# Patient Record
Sex: Male | Born: 1955 | Hispanic: No | Smoking: Current every day smoker
Health system: Southern US, Community
[De-identification: ages and names within clinical notes are randomized; demographics above are authoritative.]

## PROBLEM LIST (undated history)

## (undated) DIAGNOSIS — I10 Essential (primary) hypertension: Secondary | ICD-10-CM

## (undated) HISTORY — PX: LEG AMPUTATION: SHX1105

---

## 2000-11-05 ENCOUNTER — Encounter
Admission: RE | Admit: 2000-11-05 | Discharge: 2001-02-03 | Payer: Self-pay | Admitting: Physical Medicine & Rehabilitation

## 2007-03-21 ENCOUNTER — Ambulatory Visit: Payer: Self-pay | Admitting: Family Medicine

## 2007-07-18 ENCOUNTER — Ambulatory Visit: Payer: Self-pay | Admitting: Family Medicine

## 2007-10-13 ENCOUNTER — Ambulatory Visit: Payer: Self-pay | Admitting: Family Medicine

## 2008-01-27 ENCOUNTER — Ambulatory Visit: Payer: Self-pay | Admitting: Internal Medicine

## 2008-06-24 ENCOUNTER — Ambulatory Visit: Payer: Self-pay | Admitting: Internal Medicine

## 2008-08-16 ENCOUNTER — Ambulatory Visit: Payer: Self-pay | Admitting: Family Medicine

## 2008-09-08 ENCOUNTER — Ambulatory Visit: Payer: Self-pay | Admitting: Internal Medicine

## 2010-11-22 ENCOUNTER — Ambulatory Visit: Payer: Self-pay | Admitting: Family

## 2011-03-29 ENCOUNTER — Ambulatory Visit: Payer: Self-pay | Admitting: Internal Medicine

## 2011-05-16 ENCOUNTER — Ambulatory Visit: Payer: Self-pay | Admitting: Internal Medicine

## 2013-02-02 ENCOUNTER — Emergency Department: Payer: Self-pay | Admitting: Emergency Medicine

## 2013-07-12 ENCOUNTER — Emergency Department: Payer: Self-pay | Admitting: Emergency Medicine

## 2013-07-12 LAB — BASIC METABOLIC PANEL
ANION GAP: 6 — AB (ref 7–16)
BUN: 10 mg/dL (ref 7–18)
CALCIUM: 9.2 mg/dL (ref 8.5–10.1)
Chloride: 103 mmol/L (ref 98–107)
Co2: 26 mmol/L (ref 21–32)
Creatinine: 1.11 mg/dL (ref 0.60–1.30)
EGFR (Non-African Amer.): >60
GLUCOSE: 132 mg/dL — AB (ref 65–99)
Osmolality: 271 (ref 275–301)
Potassium: 4 mmol/L (ref 3.5–5.1)
Sodium: 135 mmol/L — ABNORMAL LOW (ref 136–145)

## 2013-07-12 LAB — CBC
HCT: 44.5 % (ref 40.0–52.0)
HGB: 15.1 g/dL (ref 13.0–18.0)
MCH: 31.4 pg (ref 26.0–34.0)
MCHC: 33.9 g/dL (ref 32.0–36.0)
MCV: 93 fL (ref 80–100)
PLATELETS: 154 10*3/uL (ref 150–440)
RBC: 4.79 10*6/uL (ref 4.40–5.90)
RDW: 13.1 % (ref 11.5–14.5)
WBC: 7.9 10*3/uL (ref 3.8–10.6)

## 2013-07-12 LAB — PRO B NATRIURETIC PEPTIDE: B-Type Natriuretic Peptide: 41 pg/mL (ref 0–125)

## 2013-07-12 LAB — TROPONIN I: Troponin-I: <0.02 ng/mL

## 2015-01-22 IMAGING — CR DG CHEST 2V
1 series · 2 of 2 positions shown · non-contrast
Comparison: Chest x-ray 05/16/2011.

CLINICAL DATA: Cough and shortness of breath.

EXAM:
CHEST  2 VIEW

[Series 1: w chest pa · 0.14mm/px · 2 of 2 slices shown]
[im 1/2]
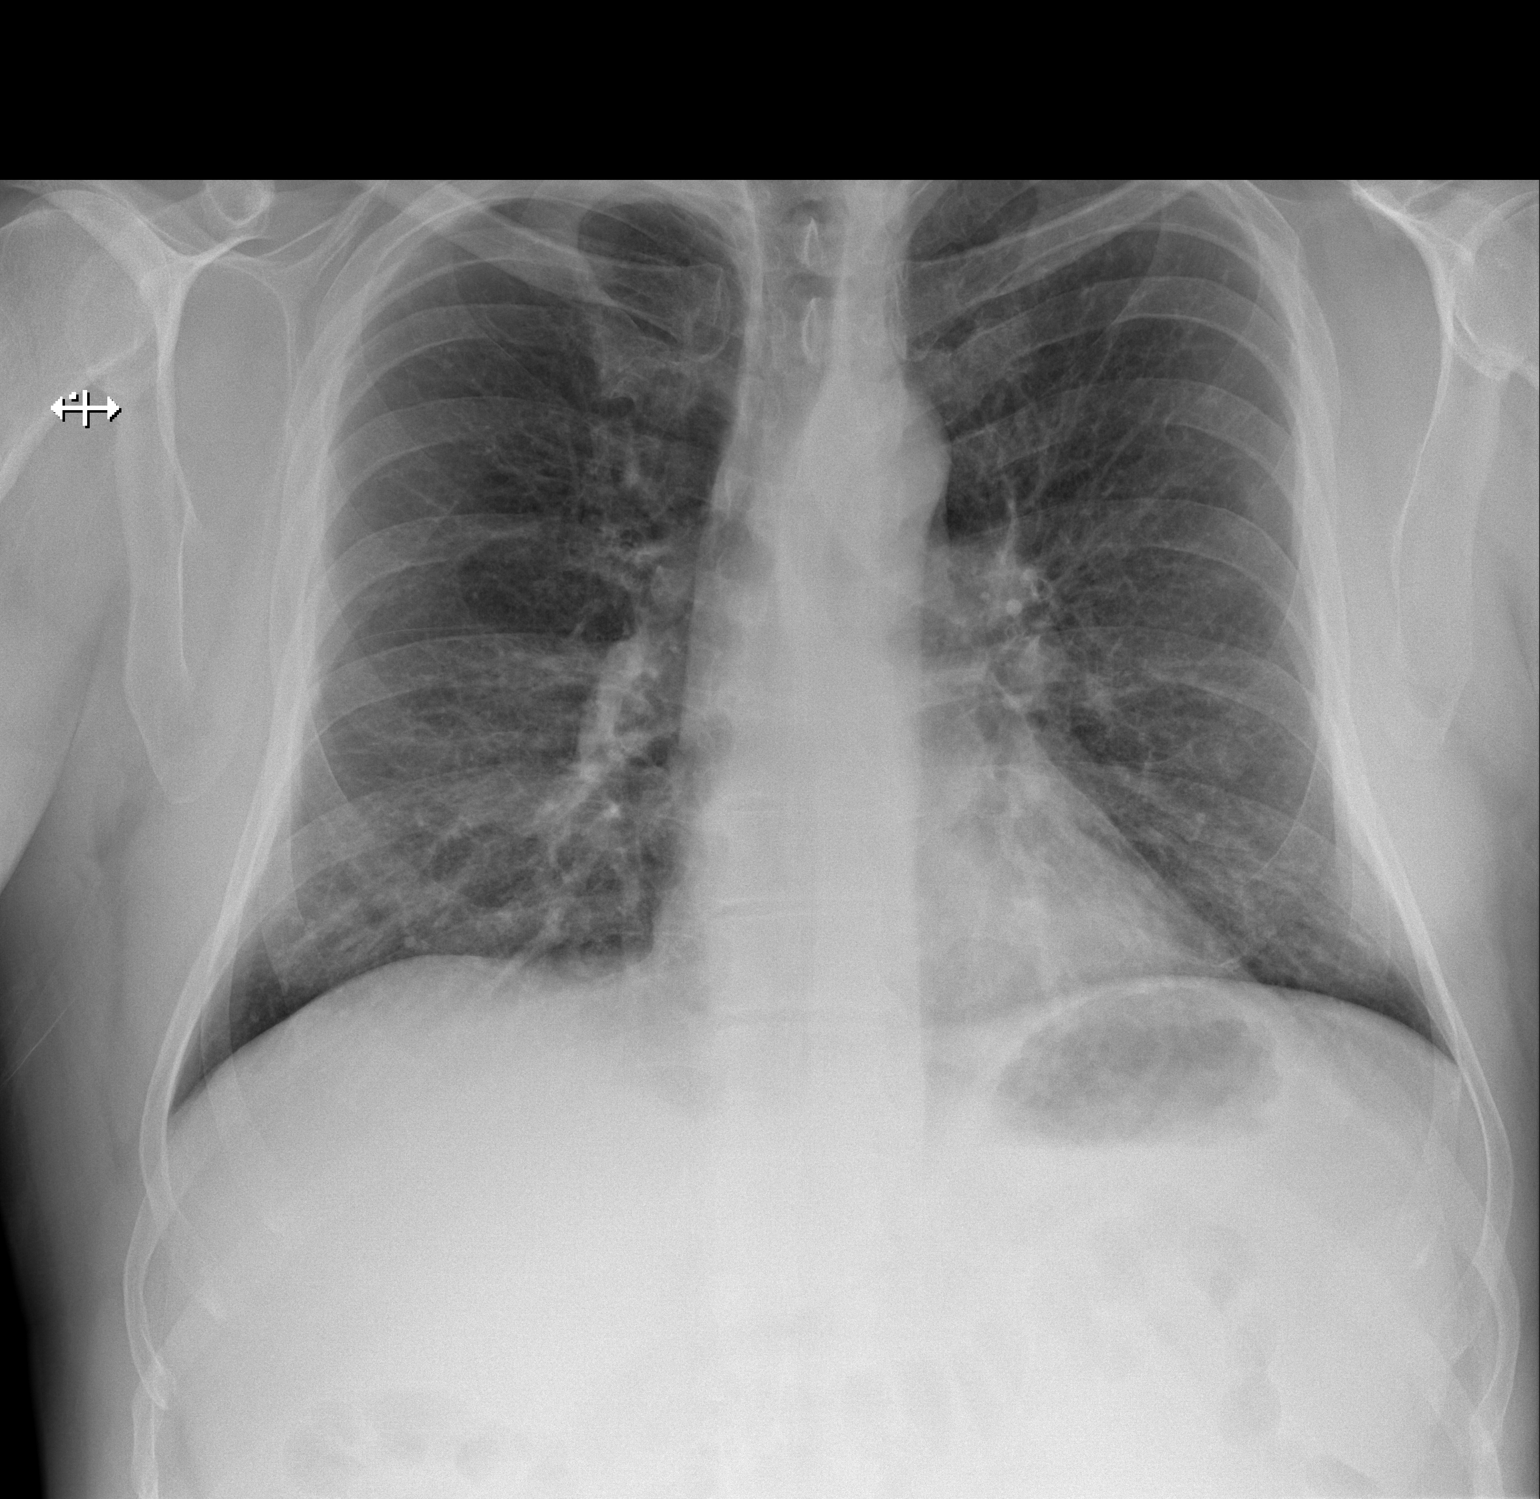
[im 2/2]
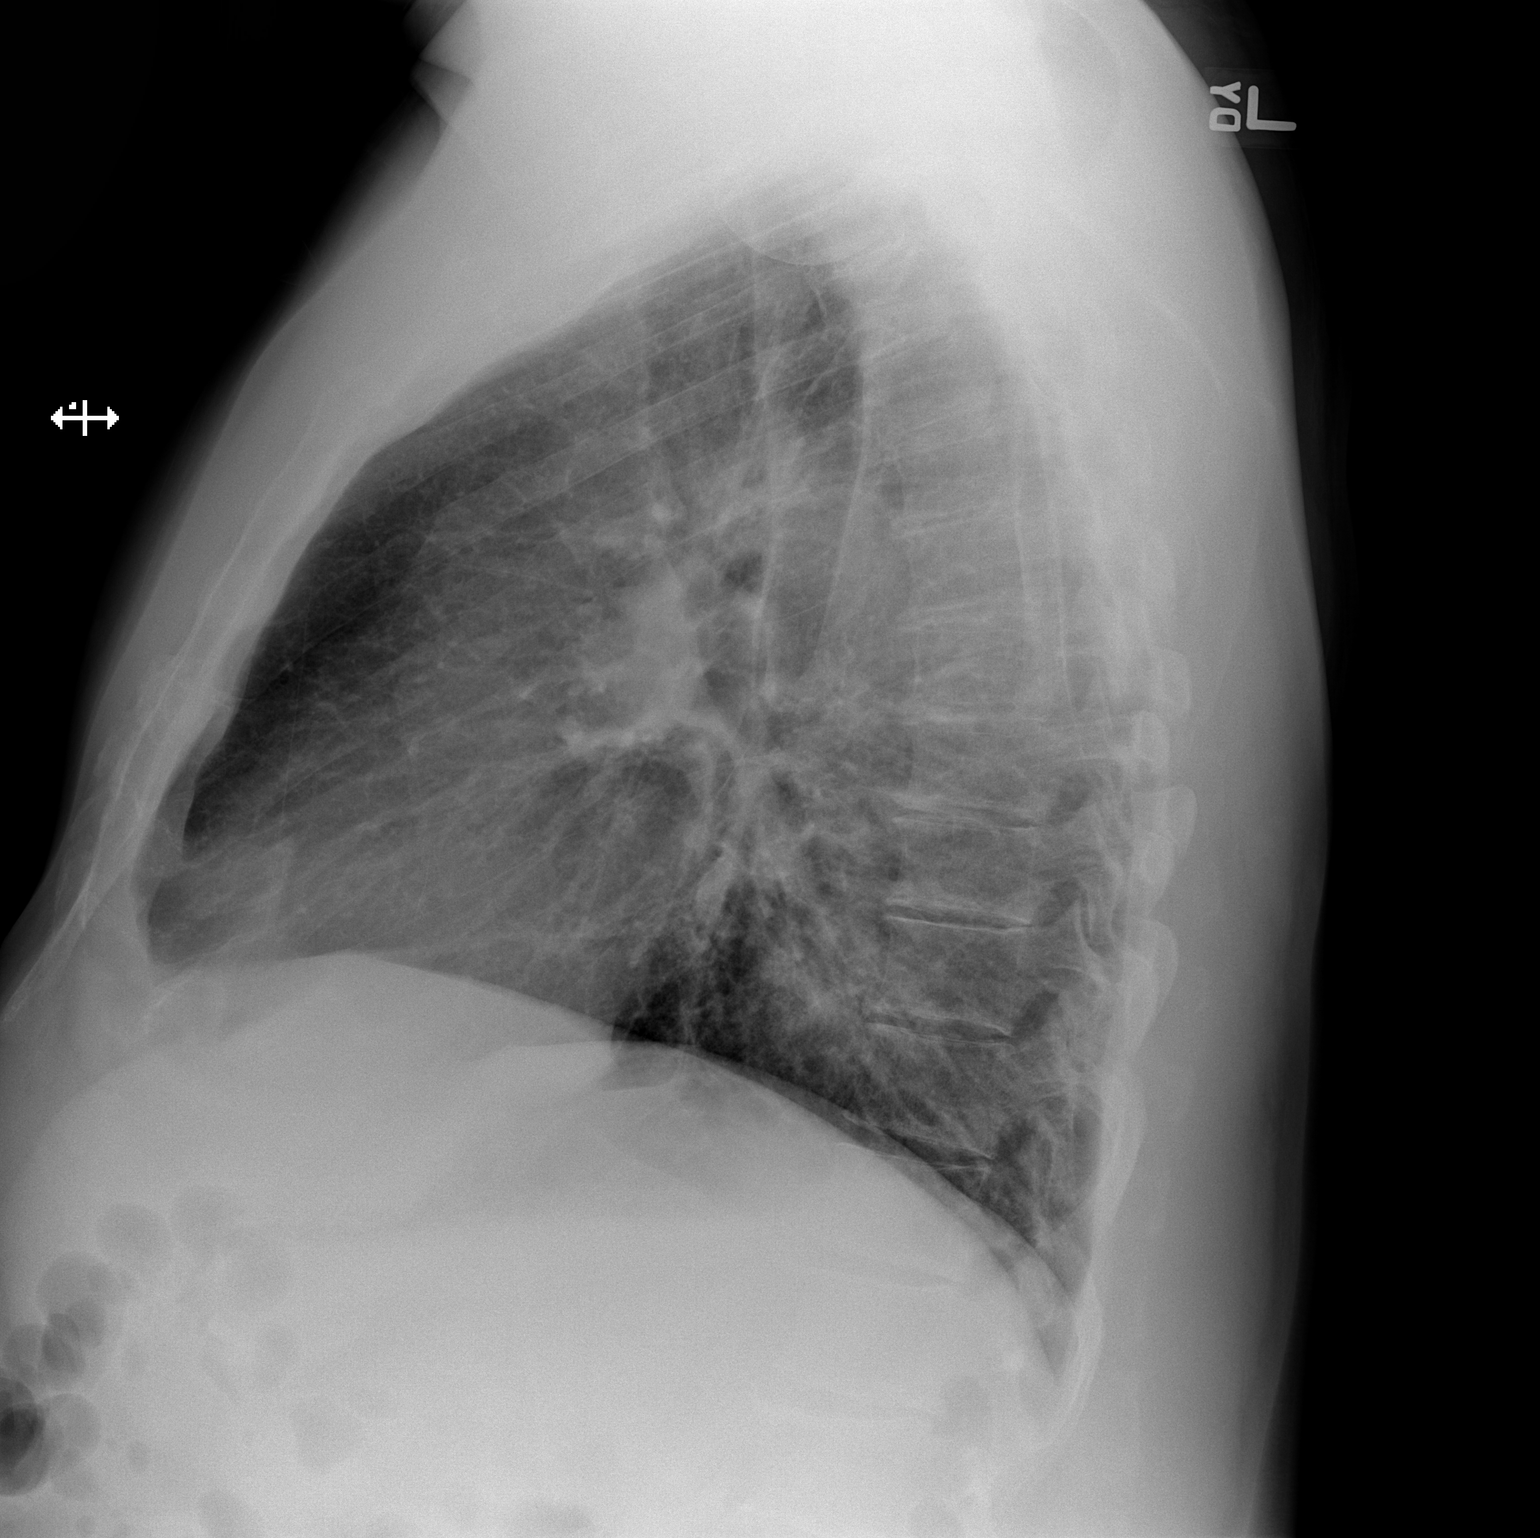

[2 of 2 positions shown; findings below may reference images not displayed]

FINDINGS: Mild diffuse peribronchial cuffing. Lung volumes are normal. No
consolidative airspace disease. No pleural effusions. No
pneumothorax. No pulmonary nodule or mass noted. Pulmonary
vasculature and the cardiomediastinal silhouette are within normal
limits.
IMPRESSION: 1. Mild diffuse peribronchial cuffing may suggest a bronchitis.

## 2016-04-28 ENCOUNTER — Ambulatory Visit
Admission: EM | Admit: 2016-04-28 | Discharge: 2016-04-28 | Disposition: A | Discharge status: Home / Self Care | Payer: Managed Care, Other (non HMO) | Attending: Family Medicine | Admitting: Family Medicine

## 2016-04-28 ENCOUNTER — Encounter: Payer: Self-pay | Admitting: Emergency Medicine

## 2016-04-28 DIAGNOSIS — L509 Urticaria, unspecified: Secondary | ICD-10-CM

## 2016-04-28 HISTORY — DX: Essential (primary) hypertension: I10

## 2016-04-28 MED ORDER — METHYLPREDNISOLONE SODIUM SUCC 125 MG IJ SOLR
125.0000 mg | Freq: Once | INTRAMUSCULAR | Status: AC
Start: 2016-04-28 — End: 2016-04-28
  Administered 2016-04-28: 125 mg via INTRAMUSCULAR

## 2016-04-28 NOTE — ED Notes (Signed)
Patient shows no signs of adverse reaction to medication at this time.  

## 2016-04-28 NOTE — ED Triage Notes (Signed)
Patient c/o itchy rash all over his body that started last night.

## 2016-04-28 NOTE — ED Provider Notes (Signed)
[facility]    CSN: [id number] Arrival date & time: 04/28/16  0840     History   Chief Complaint Chief Complaint  Patient presents with  . Rash    HPI Dorothyann GibbsFrederic A Matusek is a 60 y.o. male.   60 yo male with a c/o itchy rash all over his body. States rash started last night. Denies any chest pains, shortness of breath, wheezing, swelling. Patient states the only new possible cause is a medication (etodolac) that he started about 2 weeks ago.       Past Medical History:  Diagnosis Date  . Hypertension     There are no active problems to display for this patient.   Past Surgical History:  Procedure Laterality Date  . LEG AMPUTATION Left        Home Medications    Prior to Admission medications   Medication Sig Start Date End Date Taking? Authorizing Provider  etodolac (LODINE) 500 MG tablet Take 500 mg by mouth 2 (two) times daily.   Yes Historical Provider, MD  lisinopril (PRINIVIL,ZESTRIL) 10 MG tablet Take 10 mg by mouth daily.   Yes Historical Provider, MD    Family History History reviewed. No pertinent family history.  Social History Social History  Substance Use Topics  . Smoking status: Current Every Day Smoker  . Smokeless tobacco: Never Used  . Alcohol use Yes     Allergies   Patient has no known allergies.   Review of Systems Review of Systems   Physical Exam Triage Vital Signs ED Triage Vitals  Enc Vitals Group     BP 04/28/16 0919 (!) 144/78     Pulse Rate 04/28/16 0919 86     Resp 04/28/16 0919 17     Temp 04/28/16 0919 97.4 F (36.3 C)     Temp Source 04/28/16 0919 Tympanic     SpO2 04/28/16 0919 97 %     Weight 04/28/16 0917 212 lb (96.2 kg)     Height 04/28/16 0917 5\' 11"  (1.803 m)     Head Circumference --      Peak Flow --      Pain Score 04/28/16 0920 0     Pain Loc --      Pain Edu? --      Excl. in GC? --    No data found.   Updated Vital Signs BP (!) 144/78 (BP Location: Left Arm)   Pulse 86    Temp 97.4 F (36.3 C) (Tympanic)   Resp 17   Ht 5\' 11"  (1.803 m)   Wt 212 lb (96.2 kg)   SpO2 97%   BMI 29.57 kg/m   Visual Acuity Right Eye Distance:   Left Eye Distance:   Bilateral Distance:    Right Eye Near:   Left Eye Near:    Bilateral Near:     Physical Exam  Constitutional: He appears well-developed and well-nourished. No distress.  HENT:  Head: Normocephalic and atraumatic.  Right Ear: Tympanic membrane, external ear and ear canal normal.  Left Ear: Tympanic membrane, external ear and ear canal normal.  Nose: Nose normal.  Mouth/Throat: Uvula is midline, oropharynx is clear and moist and mucous membranes are normal. No oropharyngeal exudate or tonsillar abscesses.  Eyes: Conjunctivae and EOM are normal. Pupils are equal, round, and reactive to light. Right eye exhibits no discharge. Left eye exhibits no discharge. No scleral icterus.  Neck: Normal range of motion. Neck supple. No tracheal deviation present. No thyromegaly  present.  Cardiovascular: Normal rate, regular rhythm and normal heart sounds.   Pulmonary/Chest: Effort normal and breath sounds normal. No stridor. No respiratory distress. He has no wheezes. He has no rales. He exhibits no tenderness.  Lymphadenopathy:    He has no cervical adenopathy.  Neurological: He is alert.  Skin: Skin is warm and dry. Rash noted. Rash is urticarial (diffuse on neck, trunk, extremities). He is not diaphoretic.  Nursing note and vitals reviewed.    UC Treatments / Results  Labs (all labs ordered are listed, but only abnormal results are displayed) Labs Reviewed - No data to display  EKG  EKG Interpretation None       Radiology No results found.  Procedures Procedures (including critical care time)  Medications Ordered in UC Medications  methylPREDNISolone sodium succinate (SOLU-MEDROL) 125 mg/2 mL injection 125 mg (125 mg Intramuscular Given 04/28/16 0949)     Initial Impression / Assessment and Plan /  UC Course  I have reviewed the triage vital signs and the nursing notes.  Pertinent labs & imaging results that were available during my care of the patient were reviewed by me and considered in my medical decision making (see chart for details).  Clinical Course       Final Clinical Impressions(s) / UC Diagnoses   Final diagnoses:  Hives    New Prescriptions Discharge Medication List as of 04/28/2016  9:56 AM     1.  diagnosis reviewed with patient; patient given solumedrol 125mg  IM x1 2. Recommend supportive treatment with benadryl and ranitidine 3. Stop etodolac and follow up with pcp next week 4. Follow-up prn if symptoms worsen or don't improve   Payton Mccallumrlando Kayzen Kendzierski, MD 04/28/16 1558

## 2016-04-28 NOTE — Discharge Instructions (Signed)
Benadryl 25mg  every 6 hours as needed Zantac 150mg  daily

## 2016-05-01 ENCOUNTER — Telehealth: Payer: Self-pay

## 2016-05-01 NOTE — Telephone Encounter (Signed)
Courtesy call back completed today after patients visit at [facility]. Patient improved and will follow up with their PCP if symptoms continue or worsen.   

## 2018-05-05 ENCOUNTER — Encounter: Payer: Self-pay | Admitting: Emergency Medicine

## 2018-05-05 ENCOUNTER — Ambulatory Visit
Admission: EM | Admit: 2018-05-05 | Discharge: 2018-05-05 | Disposition: A | Discharge status: Home / Self Care | Payer: Managed Care, Other (non HMO) | Attending: Family Medicine | Admitting: Family Medicine

## 2018-05-05 ENCOUNTER — Other Ambulatory Visit: Payer: Self-pay

## 2018-05-05 DIAGNOSIS — R197 Diarrhea, unspecified: Secondary | ICD-10-CM

## 2018-05-05 DIAGNOSIS — Z87891 Personal history of nicotine dependence: Secondary | ICD-10-CM | POA: Diagnosis not present

## 2018-05-05 DIAGNOSIS — J069 Acute upper respiratory infection, unspecified: Secondary | ICD-10-CM | POA: Diagnosis not present

## 2018-05-05 DIAGNOSIS — S39011A Strain of muscle, fascia and tendon of abdomen, initial encounter: Secondary | ICD-10-CM

## 2018-05-05 MED ORDER — AZITHROMYCIN 250 MG PO TABS: 250.0000 mg | ORAL_TABLET | Freq: Every day | ORAL | 0 refills | Status: DC

## 2018-05-05 MED ORDER — HYDROCOD POLST-CPM POLST ER 10-8 MG/5ML PO SUER: 5.0000 mL | Freq: Two times a day (BID) | ORAL | 0 refills | Status: DC

## 2018-05-05 MED ORDER — BENZONATATE 200 MG PO CAPS: ORAL_CAPSULE | ORAL | 0 refills | Status: DC

## 2018-05-05 NOTE — ED Provider Notes (Addendum)
[facility]    CSN: [id number] Arrival date & time: 05/05/18  1217     History   Chief Complaint Chief Complaint  Patient presents with  . Cough    APPT  . Abdominal Pain    HPI Roger Simpson is a 62 y.o. male.   HPI  -year-old male presents with a cough that he has had for 2 weeks with intermittent shortness of breath.  Shortness of breath particularly after coughing spell.  Started having diarrhea on Thursday that he states is watery with no mucus or bleeding.  Denies any belly pain until he when he put on his jacket Friday night having severe abdominal cramping that lasted about 30 minutes.  Since that time he now has the pain in abdomen with movement particularly with changing positions from sitting to standing or from sitting to lying and vice versa.  He denies any nausea or vomiting.  Every day smoker.  Denies any fever or chills.          Past Medical History:  Diagnosis Date  . Hypertension     There are no active problems to display for this patient.   Past Surgical History:  Procedure Laterality Date  . LEG AMPUTATION Left        Home Medications    Prior to Admission medications   Medication Sig Start Date End Date Taking? Authorizing Provider  lisinopril (PRINIVIL,ZESTRIL) 10 MG tablet Take 10 mg by mouth daily.   Yes [provider]  azithromycin (ZITHROMAX) 250 MG tablet Take 1 tablet (250 mg total) by mouth daily. Take first 2 tablets together, then 1 every day until finished. 05/05/18   Lutricia Feiloemer, [place] Pacella P, PA-C  benzonatate (TESSALON) 200 MG capsule Take one cap TID PRN cough 05/05/18   Lutricia Feiloemer, Jaida Basurto P, PA-C  chlorpheniramine-HYDROcodone [facility](TUSSIONEX PENNKINETIC ER) 10-8 MG/5ML SUER Take 5 mLs by mouth 2 (two) times daily. 05/05/18   Lutricia Feiloemer, Fateh Kindle P, PA-C    Family History History reviewed. No pertinent family history.  Social History Social History   Tobacco Use  . Smoking status: Current Every Day Smoker  .  Smokeless tobacco: Never Used  Substance Use Topics  . Alcohol use: Yes  . Drug use: No     Allergies   Patient has no known allergies.   Review of Systems Review of Systems  Constitutional: Positive for activity change. Negative for appetite change, chills, diaphoresis, fatigue and fever.  HENT: Positive for congestion.   Respiratory: Positive for cough and shortness of breath.   Gastrointestinal: Positive for abdominal pain and diarrhea. Negative for abdominal distention, anal bleeding, blood in stool, constipation, nausea, rectal pain and vomiting.  All other systems reviewed and are negative.    Physical Exam Triage Vital Signs ED Triage Vitals  Enc Vitals Group     BP 05/05/18 1235 (!) 145/92     Pulse Rate 05/05/18 1235 72     Resp 05/05/18 1235 18     Temp 05/05/18 1235 98.8 F (37.1 C)     Temp Source 05/05/18 1235 Oral     SpO2 05/05/18 1235 98 %     Weight 05/05/18 1233 215 lb (97.5 kg)     Height 05/05/18 1233 5\' 11"  (1.803 m)     Head Circumference --      Peak Flow --      Pain Score 05/05/18 1233 3     Pain Loc --      Pain Edu? --  Excl. in GC? --    No data found.  Updated Vital Signs BP (!) 145/92 (BP Location: Left Arm)   Pulse 72   Temp 98.8 F (37.1 C) (Oral)   Resp 18   Ht 5\' 11"  (1.803 m)   Wt 215 lb (97.5 kg)   SpO2 98%   BMI 29.99 kg/m   Visual Acuity Right Eye Distance:   Left Eye Distance:   Bilateral Distance:    Right Eye Near:   Left Eye Near:    Bilateral Near:     Physical Exam  Constitutional: He is oriented to person, place, and time. He appears well-developed and well-nourished.  Non-toxic appearance. He does not appear ill. No distress.  HENT:  Head: Normocephalic.  Mouth/Throat: Oropharynx is clear and moist. No oropharyngeal exudate.  Eyes: Pupils are equal, round, and reactive to light.  Pulmonary/Chest: Effort normal and breath sounds normal.  Abdominal: Bowel sounds are normal. There is no tenderness.  There is no rigidity, no rebound, no guarding, no CVA tenderness, no tenderness at McBurney's point and negative Murphy's sign.  Neurological: He is alert and oriented to person, place, and time.  Skin: Skin is warm and dry.  Psychiatric: He has a normal mood and affect. His behavior is normal.  Nursing note and vitals reviewed.    UC Treatments / Results  Labs (all labs ordered are listed, but only abnormal results are displayed) Labs Reviewed - No data to display  EKG None  Radiology No results found.  Procedures Procedures (including critical care time)  Medications Ordered in UC Medications - No data to display  Initial Impression / Assessment and Plan / UC Course  I have reviewed the triage vital signs and the nursing notes.  Pertinent labs & imaging results that were available during my care of the patient were reviewed by me and considered in my medical decision making (see chart for details).   This patient has an upper respiratory infection had for over 2 weeks.  Abdominal pain is likely from abdominal strain from the coughing that he has sustained exacerbated with motion of putting his jacket on Friday.  His abdomen is examination is encouraging today with no significant signs of an acute abdomen.  Start him on a Z-Pak ;advised him with better cough suppressants and has been able to find at home.  He worsens he should go to the emergency room recommended that he follow-up with his primary care physician later this week or next.   Final Clinical Impressions(s) / UC Diagnoses   Final diagnoses:  Upper respiratory tract infection, unspecified type  Abdominal muscle strain, initial encounter  Diarrhea, unspecified type   Discharge Instructions   None    ED Prescriptions    Medication Sig Dispense Auth. Provider   azithromycin (ZITHROMAX) 250 MG tablet Take 1 tablet (250 mg total) by mouth daily. Take first 2 tablets together, then 1 every day until finished. 6  tablet Lutricia Feil, PA-C   benzonatate (TESSALON) 200 MG capsule Take one cap TID PRN cough 30 capsule Lutricia Feil, PA-C   chlorpheniramine-HYDROcodone (TUSSIONEX PENNKINETIC ER) 10-8 MG/5ML SUER Take 5 mLs by mouth 2 (two) times daily. 115 mL Lutricia Feil, PA-C     Controlled Substance Prescriptions [place] Controlled Substance Registry consulted? Not Applicable   Lutricia Feil, PA-C 05/05/18 1431    Lutricia Feil, PA-C 05/05/18 1433

## 2018-05-05 NOTE — ED Triage Notes (Signed)
Patient c/o cough x 2 weeks with intermittent shortness of breath. He states Friday he went to put his jacket on and when he raised his arms he started having abdominal cramping. Patient reports diarrhea, denies nausea and vomiting.

## 2019-11-13 ENCOUNTER — Encounter: Payer: Self-pay | Admitting: Internal Medicine

## 2019-11-13 ENCOUNTER — Ambulatory Visit (INDEPENDENT_AMBULATORY_CARE_PROVIDER_SITE_OTHER): Payer: Managed Care, Other (non HMO) | Admitting: Internal Medicine

## 2019-11-13 ENCOUNTER — Other Ambulatory Visit: Payer: Self-pay

## 2019-11-13 VITALS — BP 150/84 | HR 85 | Ht 70.0 in | Wt 206.5 lb

## 2019-11-13 DIAGNOSIS — Z720 Tobacco use: Secondary | ICD-10-CM

## 2019-11-13 DIAGNOSIS — I10 Essential (primary) hypertension: Secondary | ICD-10-CM | POA: Diagnosis not present

## 2019-11-13 DIAGNOSIS — J302 Other seasonal allergic rhinitis: Secondary | ICD-10-CM | POA: Insufficient documentation

## 2019-11-13 DIAGNOSIS — J069 Acute upper respiratory infection, unspecified: Secondary | ICD-10-CM | POA: Insufficient documentation

## 2019-11-13 DIAGNOSIS — S93601A Unspecified sprain of right foot, initial encounter: Secondary | ICD-10-CM | POA: Diagnosis not present

## 2019-11-13 NOTE — Patient Instructions (Addendum)
Dr. Lavera Guise recommended Claritin 10 mg per day for congestion.  Tobacco Use Disorder Tobacco use disorder (TUD) occurs when a person craves, seeks, and uses tobacco, regardless of the consequences. This disorder can cause problems with mental and physical health. It can affect your ability to have healthy relationships, and it can keep you from meeting your responsibilities at work, home, or school. Tobacco may be:  Smoked as a cigarette or cigar.  Inhaled using e-cigarettes.  Smoked in a pipe or hookah.  Chewed as smokeless tobacco.  Inhaled into the nostrils as snuff. Tobacco products contain a dangerous chemical called nicotine, which is very addictive. Nicotine triggers hormones that make the body feel stimulated and works on areas of the brain that make you feel good. These effects can make it hard for people to quit nicotine. Tobacco contains many other unsafe chemicals that can damage almost every organ in the body. Smoking tobacco also puts others in danger due to fire risk and possible health problems caused by breathing in secondhand smoke. What are the signs or symptoms? Symptoms of TUD may include:  Being unable to slow down or stop your tobacco use.  Spending an abnormal amount of time getting or using tobacco.  Craving tobacco. Cravings may last for up to 6 months after quitting.  Tobacco use that: ? Interferes with your work, school, or home life. ? Interferes with your personal and social relationships. ? Makes you give up activities that you once enjoyed or found important.  Using tobacco even though you know that it is: ? Dangerous or bad for your health or someone else's health. ? Causing problems in your life.  Needing more and more of the substance to get the same effect (developing tolerance).  Experiencing unpleasant symptoms if you do not use the substance (withdrawal). Withdrawal symptoms may include: ? Depressed, anxious, or irritable mood. ? Difficulty  concentrating. ? Increased appetite. ? Restlessness or trouble sleeping.  Using the substance to avoid withdrawal. How is this diagnosed? This condition may be diagnosed based on:  Your current and past tobacco use. Your health care provider may ask questions about how your tobacco use affects your life.  A physical exam. You may be diagnosed with TUD if you have at least two symptoms within a 64-month period. How is this treated? This condition is treated by stopping tobacco use. Many people are unable to quit on their own and need help. Treatment may include:  Nicotine replacement therapy (NRT). NRT provides nicotine without the other harmful chemicals in tobacco. NRT gradually lowers the dosage of nicotine in the body and reduces withdrawal symptoms. NRT is available as: ? Over-the-counter gums, lozenges, and skin patches. ? Prescription mouth inhalers and nasal sprays.  Medicine that acts on the brain to reduce cravings and withdrawal symptoms.  A type of talk therapy that examines your triggers for tobacco use, how to avoid them, and how to cope with cravings (behavioral therapy).  Hypnosis. This may help with withdrawal symptoms.  Joining a support group for others coping with TUD. The best treatment for TUD is usually a combination of medicine, talk therapy, and support groups. Recovery can be a long process. Many people start using tobacco again after stopping (relapse). If you relapse, it does not mean that treatment will not work. Follow these instructions at home:  Lifestyle  Do not use any products that contain nicotine or tobacco, such as cigarettes and e-cigarettes.  Avoid things that trigger tobacco use as much as  you can. Triggers include people and situations that usually cause you to use tobacco.  Avoid drinks that contain caffeine, including coffee. These may worsen some withdrawal symptoms.  Find ways to manage stress. Wanting to smoke may cause stress, and  stress can make you want to smoke. Relaxation techniques such as deep breathing, meditation, and yoga may help.  Attend support groups as needed. These groups are an important part of long-term recovery for many people. General instructions  Take over-the-counter and prescription medicines only as told by your health care provider.  Check with your health care provider before taking any new prescription or over-the-counter medicines.  Decide on a friend, family member, or smoking quit-line (such as 1-800-QUIT-NOW in the [place] that you can call or text when you feel the urge to smoke or when you need help coping with cravings.  Keep all follow-up visits as told by your health care provider and therapist. This is important. Contact a health care provider if:  You are not able to take your medicines as prescribed.  Your symptoms get worse, even with treatment. Summary  Tobacco use disorder (TUD) occurs when a person craves, seeks, and uses tobacco regardless of the consequences.  This condition may be diagnosed based on your current and past tobacco use and a physical exam.  Many people are unable to quit on their own and need help. Recovery can be a long process.  The most effective treatment for TUD is usually a combination of medicine, talk therapy, and support groups. This information is not intended to replace advice given to you by your health care provider. Make sure you discuss any questions you have with your health care provider. Document Revised: 05/01/2017 Document Reviewed: 05/01/2017 Elsevier Patient Education  2020 [employer].

## 2019-11-13 NOTE — Assessment & Plan Note (Signed)
Patient has been smoking 1 pack/day in the last 48 years.  He complains of some congestion and cough I discussed with him in detail how to stop smoking.  We will get a cancer screening CT scan done and it has been ordered.

## 2019-11-13 NOTE — Assessment & Plan Note (Signed)
Swelling of the right foot is gone peripheral circulation was good he does not have symptoms of gout. No  Evidence of cellulitis or DVT.

## 2019-11-13 NOTE — Assessment & Plan Note (Signed)
Blood pressure under control on lisinopril

## 2019-11-13 NOTE — Progress Notes (Signed)
Established Patient Office Visit  SUBJECTIVE:  Patient ID: Roger Simpson, male    DOB: [birth year]  Age: 64 y.o. MRN: [id number]  CC:  Chief Complaint  Patient presents with   Seasonal Allergies    Pt reports he has had cough and congestion for about 2 months with only clear phlegm    HPI DEWEY VIENS presents for difficulties with seasonal allergies for two months.   He notes that Saturday his foot was extremely swollen and you couldn't even see his ankle bone.  He did ice it and the swelling went down. He denied insect bites or stings from marine life.   He does complain of a cough, mostly attributed to environmental allergies. He notes clear phlegm with rhinorrhea.  He is a smoker with a 40 year smoking history and he reports smoking one pack per day. He has not undergone a lung cancer screening thus far.   Past Medical History:  Diagnosis Date   Hypertension     Past Surgical History:  Procedure Laterality Date   LEG AMPUTATION Left     History reviewed. No pertinent family history.  Social History   Socioeconomic History   Marital status: Unknown    Spouse name: Not on file   Number of children: Not on file   Years of education: Not on file   Highest education level: Not on file  Occupational History   Not on file  Tobacco Use   Smoking status: Current Every Day Smoker   Smokeless tobacco: Never Used  Vaping Use   Vaping Use: Never used  Substance and Sexual Activity   Alcohol use: Yes   Drug use: No   Sexual activity: Not on file  Other Topics Concern   Not on file  Social History Narrative   Not on file   Social Determinants of Health   Financial Resource Strain:    Difficulty of Paying Living Expenses:   Food Insecurity:    Worried About Programme researcher, broadcasting/film/video in the Last Year:    Barista in the Last Year:   Transportation Needs:    Freight forwarder (Medical):    Lack of Transportation (Non-Medical):     Physical Activity:    Days of Exercise per Week:    Minutes of Exercise per Session:   Stress:    Feeling of Stress :   Social Connections:    Frequency of Communication with Friends and Family:    Frequency of Social Gatherings with Friends and Family:    Attends Religious Services:    Active Member of Clubs or Organizations:    Attends Engineer, structural:    Marital Status:   Intimate Partner Violence:    Fear of Current or Ex-Partner:    Emotionally Abused:    Physically Abused:    Sexually Abused:      Current Outpatient Medications:    lisinopril (PRINIVIL,ZESTRIL) 10 MG tablet, Take 10 mg by mouth daily., Disp: , Rfl:    No Known Allergies  ROS Review of Systems  Constitutional: Negative.   HENT: Positive for rhinorrhea.   Eyes: Negative.   Respiratory: Positive for cough (Env Allergies).   Cardiovascular: Negative.   Gastrointestinal: Negative.   Endocrine: Negative.   Genitourinary: Negative.   Musculoskeletal: Negative.   Skin: Negative.   Allergic/Immunologic: Positive for environmental allergies.  Neurological: Negative.   Hematological: Negative.   Psychiatric/Behavioral: Negative.   All other systems reviewed and are negative.  OBJECTIVE:    Physical Exam Vitals reviewed.  Constitutional:      Appearance: Normal appearance.  Neck:     Vascular: No carotid bruit.  Cardiovascular:     Rate and Rhythm: Normal rate and regular rhythm.     Pulses: Normal pulses.     Heart sounds: Normal heart sounds.  Pulmonary:     Effort: Pulmonary effort is normal.     Breath sounds: Normal breath sounds. No wheezing or rhonchi.  Abdominal:     General: Bowel sounds are normal.     Palpations: Abdomen is soft. There is no hepatomegaly or splenomegaly.     Tenderness: There is no abdominal tenderness.     Hernia: No hernia is present.  Musculoskeletal:     Right lower leg: No edema.     Left lower leg: No edema.     Left Lower  Extremity: Left leg is amputated above knee.  Feet:     Right foot:     Skin integrity: No erythema.  Skin:    Findings: No rash.  Neurological:     Mental Status: He is alert and oriented to person, place, and time.  Psychiatric:        Mood and Affect: Mood normal.        Behavior: Behavior normal.     BP (!) 150/84    Pulse 85    Ht 5\' 10"  (1.778 m)    Wt 206 lb 8 oz (93.7 kg)    BMI 29.63 kg/m  Wt Readings from Last 3 Encounters:  11/13/19 206 lb 8 oz (93.7 kg)  05/05/18 215 lb (97.5 kg)  04/28/16 212 lb (96.2 kg)    Health Maintenance Due  Topic Date Due   Hepatitis C Screening  Never done   COVID-19 Vaccine (1) Never done   HIV Screening  Never done   TETANUS/TDAP  Never done   COLONOSCOPY  Never done    There are no preventive care reminders to display for this patient.  CBC Latest Ref Rng & Units 07/12/2013  WBC 3.8 - 10.6 x10 3/mm 3 7.9  Hemoglobin 13.0 - 18.0 g/dL 15.1  Hematocrit 40.0 - 52.0 % 44.5  Platelets 150 - 440 x10 3/mm 3 154   CMP Latest Ref Rng & Units 07/12/2013  Glucose 65 - 99 mg/dL 132(H)  BUN 7 - 18 mg/dL 10  Creatinine 0.60 - 1.30 mg/dL 1.11  Sodium 136 - 145 mmol/L 135(L)  Potassium 3.5 - 5.1 mmol/L 4.0  Chloride 98 - 107 mmol/L 103  CO2 21 - 32 mmol/L 26  Calcium 8.5 - 10.1 mg/dL 9.2    No results found for: TSH Lab Results  Component Value Date   ANIONGAP 6 (L) 07/12/2013   No results found for: CHOL, HDL, LDLCALC, CHOLHDL No results found for: TRIG No results found for: HGBA1C    ASSESSMENT & PLAN:   Problem List Items Addressed This Visit      Cardiovascular and Mediastinum   Essential hypertension    Blood pressure under control on lisinopril        Respiratory   Upper respiratory tract infection   Seasonal allergic rhinitis    Take Claritin 10 mg daily        Musculoskeletal and Integument   Sprain of right foot - Primary    Swelling of the right foot is gone peripheral circulation was good he does not  have symptoms of gout. No  Evidence of cellulitis or  DVT.        Other   Tobacco abuse disorder    Patient has been smoking 1 pack/day in the last 48 years.  He complains of some congestion and cough I discussed with him in detail how to stop smoking.  We will get a cancer screening CT scan done and it has been ordered.         No orders of the defined types were placed in this encounter.   1. Sprain of right foot, initial encounter   2. Upper respiratory tract infection, unspecified type   3. Tobacco abuse disorder   4. Essential hypertension   5. Seasonal allergic rhinitis, unspecified trigger  Follow-up: Return in about 2 months (around 01/13/2020).   She will be scheduled for a CT of the chest smoking cessation discussed with the patient.  Was told that if he had more pain in the right foot we will get an x-ray.  He was advised to take Claritin 10 mg p.o. daily for allergy   Dr. Woodroe Chen [facility] [address], [place], [place] [address]   By signing my name below, I, [employer], attest that this documentation has been prepared under the direction and in the presence of Corky Downs, MD. Electronically Signed: Corky Downs, MD 11/13/19, 4:48 PM   I personally performed the services described in this documentation, which was SCRIBED in my presence. The recorded information has been reviewed and considered accurate. It has been edited as necessary during review. Corky Downs, MD

## 2019-11-13 NOTE — Assessment & Plan Note (Signed)
Take Claritin 10 mg daily 

## 2019-11-16 NOTE — Addendum Note (Signed)
Addended by: Jobie Quaker on: 11/16/2019 10:49 AM   Modules accepted: Orders

## 2019-11-17 ENCOUNTER — Other Ambulatory Visit: Payer: Self-pay

## 2019-11-17 ENCOUNTER — Other Ambulatory Visit (INDEPENDENT_AMBULATORY_CARE_PROVIDER_SITE_OTHER): Payer: Managed Care, Other (non HMO)

## 2019-11-17 DIAGNOSIS — Z125 Encounter for screening for malignant neoplasm of prostate: Secondary | ICD-10-CM

## 2019-11-17 DIAGNOSIS — Z13220 Encounter for screening for lipoid disorders: Secondary | ICD-10-CM

## 2019-11-17 DIAGNOSIS — I10 Essential (primary) hypertension: Secondary | ICD-10-CM | POA: Diagnosis not present

## 2019-11-17 DIAGNOSIS — Z1329 Encounter for screening for other suspected endocrine disorder: Secondary | ICD-10-CM

## 2019-11-18 LAB — CBC WITH DIFFERENTIAL/PLATELET
Absolute Monocytes: 608 cells/uL (ref 200–950)
Basophils Absolute: 53 cells/uL (ref 0–200)
Basophils Relative: 0.7 %
Eosinophils Absolute: 180 cells/uL (ref 15–500)
Eosinophils Relative: 2.4 %
HCT: 46.4 % (ref 38.5–50.0)
Hemoglobin: 15.9 g/dL (ref 13.2–17.1)
Lymphs Abs: 2093 cells/uL (ref 850–3900)
MCH: 32.6 pg (ref 27.0–33.0)
MCHC: 34.3 g/dL (ref 32.0–36.0)
MCV: 95.3 fL (ref 80.0–100.0)
MPV: 11 fL (ref 7.5–12.5)
Monocytes Relative: 8.1 %
Neutro Abs: 4568 cells/uL (ref 1500–7800)
Neutrophils Relative %: 60.9 %
Platelets: 218 10*3/uL (ref 140–400)
RBC: 4.87 10*6/uL (ref 4.20–5.80)
RDW: 12.8 % (ref 11.0–15.0)
Total Lymphocyte: 27.9 %
WBC: 7.5 10*3/uL (ref 3.8–10.8)

## 2019-11-18 LAB — COMPLETE METABOLIC PANEL WITH GFR
AG Ratio: 1.8 (calc) (ref 1.0–2.5)
ALT: 14 U/L (ref 9–46)
AST: 16 U/L (ref 10–35)
Albumin: 4.9 g/dL (ref 3.6–5.1)
Alkaline phosphatase (APISO): 83 U/L (ref 35–144)
BUN: 16 mg/dL (ref 7–25)
CO2: 18 mmol/L — ABNORMAL LOW (ref 20–32)
Calcium: 10 mg/dL (ref 8.6–10.3)
Chloride: 102 mmol/L (ref 98–110)
Creat: 1.03 mg/dL (ref 0.70–1.25)
GFR, Est African American: 90 mL/min/{1.73_m2} (ref >=60)
GFR, Est Non African American: 77 mL/min/{1.73_m2} (ref >=60)
Globulin: 2.7 g/dL (calc) (ref 1.9–3.7)
Glucose, Bld: 100 mg/dL — ABNORMAL HIGH (ref 65–99)
Potassium: 4.5 mmol/L (ref 3.5–5.3)
Sodium: 139 mmol/L (ref 135–146)
Total Bilirubin: 0.8 mg/dL (ref 0.2–1.2)
Total Protein: 7.6 g/dL (ref 6.1–8.1)

## 2019-11-18 LAB — PSA: PSA: 0.8 ng/mL (ref <=4.0)

## 2019-11-18 LAB — TSH: TSH: 0.59 mIU/L (ref 0.40–4.50)

## 2019-11-19 ENCOUNTER — Telehealth: Payer: Self-pay | Admitting: *Deleted

## 2019-11-19 DIAGNOSIS — Z87891 Personal history of nicotine dependence: Secondary | ICD-10-CM

## 2019-11-19 DIAGNOSIS — Z122 Encounter for screening for malignant neoplasm of respiratory organs: Secondary | ICD-10-CM

## 2019-11-19 NOTE — Telephone Encounter (Signed)
Received referral for initial lung cancer screening scan. Contacted patient and obtained smoking history,(current, 42 pack year) as well as answering questions related to screening process. Patient denies signs of lung cancer such as weight loss or hemoptysis. Patient denies comorbidity that would prevent curative treatment if lung cancer were found. Patient is scheduled for shared decision making visit and CT scan on 12/08/19 at 145pm.

## 2019-12-08 ENCOUNTER — Ambulatory Visit
Admission: RE | Admit: 2019-12-08 | Discharge: 2019-12-08 | Disposition: A | Discharge status: Home / Self Care | Payer: Managed Care, Other (non HMO) | Source: Ambulatory Visit | Attending: Oncology | Admitting: Oncology

## 2019-12-08 ENCOUNTER — Inpatient Hospital Stay
Payer: Managed Care, Other (non HMO) | Attending: Hospice and Palliative Medicine | Admitting: Hospice and Palliative Medicine

## 2019-12-08 ENCOUNTER — Other Ambulatory Visit: Payer: Self-pay

## 2019-12-08 ENCOUNTER — Encounter: Payer: Self-pay | Admitting: Hospice and Palliative Medicine

## 2019-12-08 DIAGNOSIS — Z122 Encounter for screening for malignant neoplasm of respiratory organs: Secondary | ICD-10-CM

## 2019-12-08 DIAGNOSIS — Z87891 Personal history of nicotine dependence: Secondary | ICD-10-CM

## 2019-12-08 NOTE — Progress Notes (Signed)
Virtual Visit via Video Note ° °I connected with@ on 12/08/19 at@ by a video enabled telemedicine application and verified that I am speaking with the correct person using two identifiers. °  °I discussed the limitations of evaluation and management by telemedicine and the availability of in person appointments. The patient expressed understanding and agreed to proceed. ° °In accordance with CMS guidelines, patient has met eligibility criteria including age, absence of signs or symptoms of lung cancer. ° °Social History  ° °Tobacco Use  °• Smoking status: Current Every Day Smoker  °  Packs/day: 1.00  °  Years: 42.00  °  Pack years: 42.00  °  Types: Cigarettes  °• Smokeless tobacco: Never Used  °Vaping Use  °• Vaping Use: Never used  °Substance Use Topics  °• Alcohol use: Yes  °• Drug use: No  °   ° °A shared decision-making session was conducted prior to the performance of CT scan. This includes one or more decision aids, includes benefits and harms of screening, follow-up diagnostic testing, over-diagnosis, false positive rate, and total radiation exposure. °  °Counseling on the importance of adherence to annual lung cancer LDCT screening, impact of co-morbidities, and ability or willingness to undergo diagnosis and treatment is imperative for compliance of the program. °  °Counseling on the importance of continued smoking cessation for former smokers; the importance of smoking cessation for current smokers, and information about tobacco cessation interventions have been given to patient including [place] [facility]uit [facility] and [facility] quit [place] programs. °  °Written order for lung cancer screening with LDCT has been given to the patient and any and all questions have been answered to the best of my abilities.  °  °Yearly follow up will be coordinated by Shawn Perkins, Thoracic Navigator. ° °Time Total: 15 minutes ° °Visit consisted of counseling and education dealing with complex health screening. Greater than 50%  of this  time was spent counseling and coordinating care related to the above assessment and plan. ° °Signed by: °Joshua Borders, PhD, NP-C ° °

## 2019-12-11 ENCOUNTER — Encounter: Payer: Self-pay | Admitting: *Deleted

## 2019-12-22 ENCOUNTER — Other Ambulatory Visit: Payer: Self-pay | Admitting: Internal Medicine

## 2020-01-27 ENCOUNTER — Ambulatory Visit (INDEPENDENT_AMBULATORY_CARE_PROVIDER_SITE_OTHER): Payer: Managed Care, Other (non HMO) | Admitting: Internal Medicine

## 2020-01-27 ENCOUNTER — Encounter: Payer: Self-pay | Admitting: Internal Medicine

## 2020-01-27 ENCOUNTER — Other Ambulatory Visit: Payer: Self-pay

## 2020-01-27 VITALS — BP 128/73 | HR 74 | Ht 70.0 in | Wt 204.5 lb

## 2020-01-27 DIAGNOSIS — E8881 Metabolic syndrome: Secondary | ICD-10-CM | POA: Diagnosis not present

## 2020-01-27 DIAGNOSIS — Z720 Tobacco use: Secondary | ICD-10-CM

## 2020-01-27 DIAGNOSIS — J302 Other seasonal allergic rhinitis: Secondary | ICD-10-CM | POA: Diagnosis not present

## 2020-01-27 DIAGNOSIS — I10 Essential (primary) hypertension: Secondary | ICD-10-CM | POA: Diagnosis not present

## 2020-01-27 DIAGNOSIS — I7090 Unspecified atherosclerosis: Secondary | ICD-10-CM

## 2020-01-27 NOTE — Assessment & Plan Note (Signed)
-   I encouraged the patient to lose weight.  - I educated them on making healthy dietary choices including eating more fruits and vegetables and less fried foods. - I encouraged the patient to exercise more, and educated on the benefits of exercise including weight loss, diabetes management, and hypertension controll.

## 2020-01-27 NOTE — Assessment & Plan Note (Signed)
Take claritin  5 mg po  As needed 

## 2020-01-27 NOTE — Progress Notes (Signed)
Established Patient Office Visit  SUBJECTIVE:  Subjective  Patient ID: Roger Simpson, male    DOB: May 30, 1955  Age: 64 y.o. MRN: [id number]  CC:  Chief Complaint  Patient presents with  . lab results    HPI Roger Simpson is a 64 y.o. male presenting today for a discussion about his recent laboratory results.   Labs on 11/17/2019 revealed his CBC was normal. His CMP was normal except for his CO2 at 18. PSA was 0.8. TSH was 0.59.  He had a chest CT for lung cancer screening on 12/08/2019 revealing lung-RADS 2, benign appearance or behavior. Continue annual screening with low-dose chest CT without contrast in 12 months. Emphysema and aortic atherosclerosis. Coronary artery calcifications noted.  He continues to smoke. He notes that he's tried to quit smoking and it has always been very difficult for him so he doesn't seem too motivated to stop. He is not vaccinated against COVID19 and is hesitant to get it at this time.    Past Medical History:  Diagnosis Date  . Hypertension     Past Surgical History:  Procedure Laterality Date  . LEG AMPUTATION Left     History reviewed. No pertinent family history.  Social History   Socioeconomic History  . Marital status: Unknown    Spouse name: Not on file  . Number of children: Not on file  . Years of education: Not on file  . Highest education level: Not on file  Occupational History  . Not on file  Tobacco Use  . Smoking status: Current Every Day Smoker    Packs/day: 1.00    Years: 42.00    Pack years: 42.00    Types: Cigarettes  . Smokeless tobacco: Never Used  Vaping Use  . Vaping Use: Never used  Substance and Sexual Activity  . Alcohol use: Yes  . Drug use: No  . Sexual activity: Not on file  Other Topics Concern  . Not on file  Social History Narrative  . Not on file   Social Determinants of Health   Financial Resource Strain:   . Difficulty of Paying Living Expenses: Not on file  Food  Insecurity:   . Worried About Programme researcher, broadcasting/film/video in the Last Year: Not on file  . Ran Out of Food in the Last Year: Not on file  Transportation Needs:   . Lack of Transportation (Medical): Not on file  . Lack of Transportation (Non-Medical): Not on file  Physical Activity:   . Days of Exercise per Week: Not on file  . Minutes of Exercise per Session: Not on file  Stress:   . Feeling of Stress : Not on file  Social Connections:   . Frequency of Communication with Friends and Family: Not on file  . Frequency of Social Gatherings with Friends and Family: Not on file  . Attends Religious Services: Not on file  . Active Member of Clubs or Organizations: Not on file  . Attends Banker Meetings: Not on file  . Marital Status: Not on file  Intimate Partner Violence:   . Fear of Current or Ex-Partner: Not on file  . Emotionally Abused: Not on file  . Physically Abused: Not on file  . Sexually Abused: Not on file     Current Outpatient Medications:  .  lisinopril (PRINIVIL,ZESTRIL) 10 MG tablet, Take 10 mg by mouth daily., Disp: , Rfl:    No Known Allergies  ROS Review of Systems  Constitutional:  Negative.   HENT: Negative.   Eyes: Negative.   Respiratory: Negative.   Cardiovascular: Negative.   Gastrointestinal: Negative.   Endocrine: Negative.   Genitourinary: Negative.   Musculoskeletal: Negative.   Skin: Negative.   Allergic/Immunologic: Negative.   Neurological: Negative.   Hematological: Negative.   Psychiatric/Behavioral: Negative.   All other systems reviewed and are negative.    OBJECTIVE:    Physical Exam Vitals reviewed.  Constitutional:      Appearance: Normal appearance.  HENT:     Mouth/Throat:     Mouth: Mucous membranes are moist.  Eyes:     Pupils: Pupils are equal, round, and reactive to light.  Neck:     Vascular: No carotid bruit.  Cardiovascular:     Rate and Rhythm: Normal rate and regular rhythm.     Pulses: Normal pulses.      Heart sounds: Normal heart sounds.  Pulmonary:     Effort: Pulmonary effort is normal.     Breath sounds: Normal breath sounds.  Abdominal:     General: Bowel sounds are normal.     Palpations: Abdomen is soft. There is no hepatomegaly, splenomegaly or mass.     Tenderness: There is no abdominal tenderness.     Hernia: No hernia is present.  Musculoskeletal:     Cervical back: Neck supple.     Right lower leg: No edema.     Left lower leg: No edema.     Left Lower Extremity: Left leg is amputated above knee.  Skin:    Findings: No rash.  Neurological:     Mental Status: He is alert and oriented to person, place, and time.     Motor: No weakness.  Psychiatric:        Mood and Affect: Mood normal.        Behavior: Behavior normal.     BP 128/73   Pulse 74   Ht 5\' 10"  (1.778 m)   Wt 204 lb 8 oz (92.8 kg)   BMI 29.34 kg/m  Wt Readings from Last 3 Encounters:  01/27/20 204 lb 8 oz (92.8 kg)  12/08/19 205 lb (93 kg)  11/13/19 206 lb 8 oz (93.7 kg)    Health Maintenance Due  Topic Date Due  . Hepatitis C Screening  Never done  . COVID-19 Vaccine (1) Never done  . HIV Screening  Never done  . TETANUS/TDAP  Never done  . COLONOSCOPY  Never done  . INFLUENZA VACCINE  Never done    There are no preventive care reminders to display for this patient.  CBC Latest Ref Rng & Units 11/17/2019 07/12/2013  WBC 3.8 - 10.8 Thousand/uL 7.5 7.9  Hemoglobin 13.2 - 17.1 g/dL 07/14/2013 78.2  Hematocrit 38 - 50 % 46.4 44.5  Platelets 140 - 400 Thousand/uL 218 154   CMP Latest Ref Rng & Units 11/17/2019 07/12/2013  Glucose 65 - 99 mg/dL 07/14/2013) 536(R)  BUN 7 - 25 mg/dL 16 10  Creatinine 443(X - 1.25 mg/dL 5.40 0.86  Sodium 7.61 - 146 mmol/L 139 135(L)  Potassium 3.5 - 5.3 mmol/L 4.5 4.0  Chloride 98 - 110 mmol/L 102 103  CO2 20 - 32 mmol/L 18(L) 26  Calcium 8.6 - 10.3 mg/dL 950 9.2  Total Protein 6.1 - 8.1 g/dL 7.6 -  Total Bilirubin 0.2 - 1.2 mg/dL 0.8 -  AST 10 - 35 U/L 16 -  ALT 9 -  46 U/L 14 -    Lab Results  Component  Value Date   TSH 0.59 11/17/2019   Lab Results  Component Value Date   ANIONGAP 6 (L) 07/12/2013   No results found for: CHOL, HDL, LDLCALC, CHOLHDL No results found for: TRIG No results found for: HGBA1C    ASSESSMENT & PLAN:   Problem List Items Addressed This Visit      Cardiovascular and Mediastinum   Essential hypertension - Primary    - Today, the patient's blood pressure is well managed on ace inhibitor. - The patient will continue the current treatment regimen.  - I encouraged the patient to eat a low-sodium diet to help control blood pressure. - I encouraged the patient to live an active lifestyle and complete activities that increases heart rate to 85% target heart rate at least 5 times per week for one hour.         Atherosclerosis    Pt was advised  To quit smoking        Respiratory   Seasonal allergic rhinitis    Take claritin 5 mg po  As needed        Other   Tobacco abuse disorder    - I instructed the patient to stop smoking and provided them with smoking cessation materials.  - I informed the patient that smoking puts them at increased risk for cancer, COPD, hypertension, and more.  - Informed the patient to seek help if they begin to have trouble breathing, develop chest pain, start to cough up blood, feel faint, or pass out.        Metabolic syndrome    - I encouraged the patient to lose weight.  - I educated them on making healthy dietary choices including eating more fruits and vegetables and less fried foods. - I encouraged the patient to exercise more, and educated on the benefits of exercise including weight loss, diabetes management, and hypertension controll.           No orders of the defined types were placed in this encounter. labs  Discussed  With pt    Follow-up: Return in about 3 months (around 04/27/2020).    Dr. Woodroe Chen [facility] [address]  [place], [place] [address]   By signing my name below, I, [employer], attest that this documentation has been prepared under the direction and in the presence of Corky Downs, MD. Electronically Signed: Corky Downs, MD I personally performed the services described in this documentation, which was SCRIBED in my presence. The recorded information has been reviewed and considered accurate. It has been edited as necessary during review. Corky Downs, MD

## 2020-01-27 NOTE — Patient Instructions (Signed)
Understanding How COVID-19 Vaccines Work Updated Oct 22, 2019  What You Need to Know . COVID-19 vaccines are safe and effective. . You may have side effects after vaccination, but these are normal. . It typically takes two weeks after you are fully vaccinated for the body to build protection (immunity) against the virus that causes COVID-19. . If you are not vaccinated, find a vaccine. Keep taking all precautions until you are fully vaccinated. . If you are fully vaccinated, you can resume activities that you did prior to the pandemic. Learn more about what you can do when you have been fully vaccinated.  Vaccine sticker and vial The Immune System--the Body's Defense Against Infection To understand how COVID-19 vaccines work, it helps to first look at how our bodies fight illness. When germs, such as the virus that causes COVID-19, invade our bodies, they attack and multiply. This invasion, called an infection, is what causes illness. Our immune system uses several tools to fight infection. Blood contains red cells, which carry oxygen to tissues and organs, and white or immune cells, which fight infection. Different types of white blood cells fight infection in different ways:  . Macrophages are white blood cells that swallow up and digest germs and dead or dying cells. The macrophages leave behind parts of the invading germs, called "antigens". The body identifies antigens as dangerous and stimulates antibodies to attack them. . B-lymphocytes are defensive white blood cells. They produce antibodies that attack the pieces of the virus left behind by the macrophages. . T-lymphocytes are another type of defensive white blood cell. They attack cells in the body that have already been infected. . The first time a person is infected with the virus that causes COVID-19, it can take several days or weeks for their body to make and use all the germ-fighting tools needed to get over the infection. After the  infection, the person's immune system remembers what it learned about how to protect the body against that disease.  The body keeps a few T-lymphocytes, called "memory cells," that go into action quickly if the body encounters the same virus again. When the familiar antigens are detected, B-lymphocytes produce antibodies to attack them. Experts are still learning how long these memory cells protect a person against the virus that causes COVID-19.  How COVID-19 Vaccines Work COVID-19 vaccines help our bodies develop immunity to the virus that causes COVID-19 without us having to get the illness.  COVID vaccine Different types of vaccines work in different ways to offer protection. But with all types of vaccines, the body is left with a supply of "memory" T-lymphocytes as well as B-lymphocytes that will remember how to fight that virus in the future.  It typically takes a few weeks after vaccination for the body to produce T-lymphocytes and B-lymphocytes. Therefore, it is possible that a person could be infected with the virus that causes COVID-19 just before or just after vaccination and then get sick because the vaccine did not have enough time to provide protection.  Sometimes after vaccination, the process of building immunity can cause symptoms, such as fever. These symptoms are normal and are signs that the body is building immunity.  Learn more about getting your vaccine.  Types of Vaccines Currently, there are three main types of COVID-19 vaccines that are authorized and recommended or undergoing large-scale (Phase 3) clinical trials in the [place].  Below is a description of how each type of vaccine prompts our bodies to recognize and   protect us from the virus that causes COVID-19. None of these vaccines can give you COVID-19.  . mRNA vaccines contain material from the virus that causes COVID-19 that gives our cells instructions for how to make a harmless protein that is unique to  the virus. After our cells make copies of the protein, they destroy the genetic material from the vaccine. Our bodies recognize that the protein should not be there and build T-lymphocytes and B-lymphocytes that will remember how to fight the virus that causes COVID-19 if we are infected in the future. . Protein subunit vaccines include harmless pieces (proteins) of the virus that causes COVID-19 instead of the entire germ. Once vaccinated, our bodies recognize that the protein should not be there and build T-lymphocytes and antibodies that will remember how to fight the virus that causes COVID-19 if we are infected in the future. . Vector vaccines contain a modified version of a different virus than the one that causes COVID-19. Inside the shell of the modified virus, there is material from the virus that causes COVID-19. This is called a "viral vector." Once the viral vector is inside our cells, the genetic material gives cells instructions to make a protein that is unique to the virus that causes COVID-19. Using these instructions, our cells make copies of the protein. This prompts our bodies to build T-lymphocytes and B-lymphocytes that will remember how to fight that virus if we are infected in the future.  Some COVID-19 Vaccines Require More Than One Shot To be fully vaccinated, you will need two shots of some COVID-19 vaccines.  . Two shots: If you get a COVID-19 vaccine that requires two shots, you are considered fully vaccinated two weeks after your second shot. Pfizer-BioNTech and Moderna COVID-19 vaccines require two shots. . One Shot: If you get a COVID-19 vaccine that requires one shot, you are considered fully vaccinated two weeks after your shot. Johnson [employer] Johnson's Janssen COVID-19 vaccine only requires one shot. . If it has been less than two weeks since your shot, or if you still need to get your second shot, you are NOT fully protected. Keep taking steps to protect yourself and others  until you are fully vaccinated (two weeks after your final shot).   Information from: https://www.cdc.gov/coronavirus/2019-ncov/vaccines/different-vaccines/how-they-work.html  

## 2020-01-27 NOTE — Assessment & Plan Note (Signed)
-   I instructed the patient to stop smoking and provided them with smoking cessation materials.  - I informed the patient that smoking puts them at increased risk for cancer, COPD, hypertension, and more.  - Informed the patient to seek help if they begin to have trouble breathing, develop chest pain, start to cough up blood, feel faint, or pass out.  

## 2020-01-27 NOTE — Assessment & Plan Note (Signed)
-   Today, the patient's blood pressure is well managed on ace inhibitor. - The patient will continue the current treatment regimen.  - I encouraged the patient to eat a low-sodium diet to help control blood pressure. - I encouraged the patient to live an active lifestyle and complete activities that increases heart rate to 85% target heart rate at least 5 times per week for one hour.     

## 2020-01-27 NOTE — Assessment & Plan Note (Signed)
Pt was advised  To quit smoking

## 2020-05-02 ENCOUNTER — Encounter: Payer: Self-pay | Admitting: Internal Medicine

## 2020-05-02 ENCOUNTER — Ambulatory Visit (INDEPENDENT_AMBULATORY_CARE_PROVIDER_SITE_OTHER): Payer: Managed Care, Other (non HMO) | Admitting: Internal Medicine

## 2020-05-02 ENCOUNTER — Other Ambulatory Visit: Payer: Self-pay

## 2020-05-02 VITALS — BP 134/74 | HR 88 | Ht 70.0 in | Wt 204.1 lb

## 2020-05-02 DIAGNOSIS — I7090 Unspecified atherosclerosis: Secondary | ICD-10-CM | POA: Diagnosis not present

## 2020-05-02 DIAGNOSIS — J302 Other seasonal allergic rhinitis: Secondary | ICD-10-CM | POA: Diagnosis not present

## 2020-05-02 DIAGNOSIS — I10 Essential (primary) hypertension: Secondary | ICD-10-CM | POA: Diagnosis not present

## 2020-05-02 DIAGNOSIS — Z720 Tobacco use: Secondary | ICD-10-CM

## 2020-05-02 NOTE — Assessment & Plan Note (Signed)
Patient was advised to continue taking ACE inhibitor.  His blood pressure is stable on the present regimen.  He was also advised to follow low-cholesterol diet.

## 2020-05-02 NOTE — Assessment & Plan Note (Signed)
Advised to take Claritin 10 mg p.o. daily 

## 2020-05-02 NOTE — Assessment & Plan Note (Signed)
-   I instructed the patient to stop smoking and provided them with smoking cessation materials.  - I informed the patient that smoking puts them at increased risk for cancer, COPD, hypertension, and more.  - Informed the patient to seek help if they begin to have trouble breathing, develop chest pain, start to cough up blood, feel faint, or pass out.  

## 2020-05-02 NOTE — Progress Notes (Signed)
Established Patient Office Visit  Subjective:  Patient ID: Roger Simpson, male    DOB: 1956-01-03  Age: 64 y.o. MRN: [id number]  CC:  Chief Complaint  Patient presents with  . Hypertension    HPI  Roger Simpson presents for allergic rhinitis.  No fever chills chest pain shortness of breath legs or abdominal pain.  Past Medical History:  Diagnosis Date  . Hypertension     Past Surgical History:  Procedure Laterality Date  . LEG AMPUTATION Left     No family history on file.  Social History   Socioeconomic History  . Marital status: Unknown    Spouse name: Not on file  . Number of children: Not on file  . Years of education: Not on file  . Highest education level: Not on file  Occupational History  . Not on file  Tobacco Use  . Smoking status: Current Every Day Smoker    Packs/day: 1.00    Years: 42.00    Pack years: 42.00    Types: Cigarettes  . Smokeless tobacco: Never Used  Vaping Use  . Vaping Use: Never used  Substance and Sexual Activity  . Alcohol use: Yes  . Drug use: No  . Sexual activity: Not on file  Other Topics Concern  . Not on file  Social History Narrative  . Not on file   Social Determinants of Health   Financial Resource Strain:   . Difficulty of Paying Living Expenses: Not on file  Food Insecurity:   . Worried About Programme researcher, broadcasting/film/video in the Last Year: Not on file  . Ran Out of Food in the Last Year: Not on file  Transportation Needs:   . Lack of Transportation (Medical): Not on file  . Lack of Transportation (Non-Medical): Not on file  Physical Activity:   . Days of Exercise per Week: Not on file  . Minutes of Exercise per Session: Not on file  Stress:   . Feeling of Stress : Not on file  Social Connections:   . Frequency of Communication with Friends and Family: Not on file  . Frequency of Social Gatherings with Friends and Family: Not on file  . Attends Religious Services: Not on file  . Active Member of Clubs  or Organizations: Not on file  . Attends Banker Meetings: Not on file  . Marital Status: Not on file  Intimate Partner Violence:   . Fear of Current or Ex-Partner: Not on file  . Emotionally Abused: Not on file  . Physically Abused: Not on file  . Sexually Abused: Not on file     Current Outpatient Medications:  .  lisinopril (PRINIVIL,ZESTRIL) 10 MG tablet, Take 10 mg by mouth daily., Disp: , Rfl:    No Known Allergies  ROS Review of Systems  Constitutional: Negative.  Negative for chills, fatigue and fever.  HENT: Negative.  Negative for congestion, ear pain and sneezing.   Eyes: Negative.   Respiratory: Negative.   Cardiovascular: Negative.   Gastrointestinal: Negative.   Endocrine: Negative.   Genitourinary: Negative.   Musculoskeletal: Negative.   Skin: Negative.   Allergic/Immunologic: Negative.   Neurological: Negative.   Hematological: Negative.   Psychiatric/Behavioral: Negative.   All other systems reviewed and are negative.     Objective:    Physical Exam Vitals reviewed.  Constitutional:      Appearance: Normal appearance.  HENT:     Mouth/Throat:     Mouth: Mucous membranes are  moist.  Eyes:     Pupils: Pupils are equal, round, and reactive to light.  Neck:     Vascular: No carotid bruit.  Cardiovascular:     Rate and Rhythm: Normal rate and regular rhythm.     Pulses: Normal pulses.     Heart sounds: Normal heart sounds.  Pulmonary:     Effort: Pulmonary effort is normal.     Breath sounds: Normal breath sounds.  Abdominal:     General: Bowel sounds are normal.     Palpations: Abdomen is soft. There is no hepatomegaly, splenomegaly or mass.     Tenderness: There is no abdominal tenderness.     Hernia: No hernia is present.  Musculoskeletal:     Cervical back: Neck supple.     Right lower leg: No edema.     Left lower leg: No edema.  Skin:    Findings: No rash.  Neurological:     Mental Status: He is alert and oriented to  person, place, and time.     Motor: No weakness.  Psychiatric:        Mood and Affect: Mood normal.        Behavior: Behavior normal.     BP 134/74   Pulse 88   Ht 5\' 10"  (1.778 m)   Wt 204 lb 1.6 oz (92.6 kg)   BMI 29.29 kg/m  Wt Readings from Last 3 Encounters:  05/02/20 204 lb 1.6 oz (92.6 kg)  01/27/20 204 lb 8 oz (92.8 kg)  12/08/19 205 lb (93 kg)     Health Maintenance Due  Topic Date Due  . Hepatitis C Screening  Never done  . COVID-19 Vaccine (1) Never done  . HIV Screening  Never done  . TETANUS/TDAP  Never done  . COLONOSCOPY  Never done  . INFLUENZA VACCINE  Never done    There are no preventive care reminders to display for this patient.  Lab Results  Component Value Date   TSH 0.59 11/17/2019   Lab Results  Component Value Date   WBC 7.5 11/17/2019   HGB 15.9 11/17/2019   HCT 46.4 11/17/2019   MCV 95.3 11/17/2019   PLT 218 11/17/2019   Lab Results  Component Value Date   NA 139 11/17/2019   K 4.5 11/17/2019   CO2 18 (L) 11/17/2019   GLUCOSE 100 (H) 11/17/2019   BUN 16 11/17/2019   CREATININE 1.03 11/17/2019   BILITOT 0.8 11/17/2019   AST 16 11/17/2019   ALT 14 11/17/2019   PROT 7.6 11/17/2019   CALCIUM 10.0 11/17/2019   ANIONGAP 6 (L) 07/12/2013   No results found for: CHOL No results found for: HDL No results found for: LDLCALC No results found for: TRIG No results found for: CHOLHDL No results found for: 07/14/2013    Assessment & Plan:   Problem List Items Addressed This Visit    None      No orders of the defined types were placed in this encounter.   Follow-up: No follow-ups on file.  Patient was advised to take Covid shot. [id number], MD

## 2020-05-02 NOTE — Assessment & Plan Note (Signed)
Patient was advised to follow low-cholesterol diet.  Walk on a daily basis take 1 baby aspirin a day.

## 2020-05-03 ENCOUNTER — Ambulatory Visit (INDEPENDENT_AMBULATORY_CARE_PROVIDER_SITE_OTHER): Payer: Managed Care, Other (non HMO)

## 2020-05-03 DIAGNOSIS — Z23 Encounter for immunization: Secondary | ICD-10-CM

## 2020-05-16 ENCOUNTER — Other Ambulatory Visit: Payer: Self-pay | Admitting: *Deleted

## 2020-05-16 MED ORDER — LISINOPRIL 10 MG PO TABS: 10.0000 mg | ORAL_TABLET | Freq: Every day | ORAL | 3 refills | Status: DC

## 2020-05-18 NOTE — Progress Notes (Signed)
Established Patient Office Visit  Subjective:  Patient ID: Roger Simpson, male    DOB: 1955-07-10  Age: 64 y.o. MRN: [id number]  CC:  Chief Complaint  Patient presents with   Hypertension    HPI  [name] presents for hbp check  Past Medical History:  Diagnosis Date   Hypertension     Past Surgical History:  Procedure Laterality Date   LEG AMPUTATION Left     History reviewed. No pertinent family history.  Social History   Socioeconomic History   Marital status: Unknown    Spouse name: Not on file   Number of children: Not on file   Years of education: Not on file   Highest education level: Not on file  Occupational History   Not on file  Tobacco Use   Smoking status: Current Every Day Smoker    Packs/day: 1.00    Years: 42.00    Pack years: 42.00    Types: Cigarettes   Smokeless tobacco: Never Used  Vaping Use   Vaping Use: Never used  Substance and Sexual Activity   Alcohol use: Yes   Drug use: No   Sexual activity: Not on file  Other Topics Concern   Not on file  Social History Narrative   Not on file   Social Determinants of Health   Financial Resource Strain: Not on file  Food Insecurity: Not on file  Transportation Needs: Not on file  Physical Activity: Not on file  Stress: Not on file  Social Connections: Not on file  Intimate Partner Violence: Not on file     Current Outpatient Medications:    lisinopril (ZESTRIL) 10 MG tablet, Take 1 tablet (10 mg total) by mouth daily., Disp: 90 tablet, Rfl: 3   No Known Allergies  ROS Review of Systems  Constitutional: Negative.   HENT: Negative.   Eyes: Negative.   Respiratory: Negative.   Cardiovascular: Negative.   Gastrointestinal: Negative.   Endocrine: Negative.   Genitourinary: Negative.   Musculoskeletal: Negative.   Skin: Negative.   Allergic/Immunologic: Negative.   Neurological: Negative.   Hematological: Negative.   Psychiatric/Behavioral:  Negative.   All other systems reviewed and are negative.     Objective:    Physical Exam Vitals reviewed.  Constitutional:      Appearance: Normal appearance.  HENT:     Mouth/Throat:     Mouth: Mucous membranes are moist.  Eyes:     Pupils: Pupils are equal, round, and reactive to light.  Neck:     Vascular: No carotid bruit.  Cardiovascular:     Rate and Rhythm: Normal rate and regular rhythm.     Pulses: Normal pulses.     Heart sounds: Normal heart sounds.  Pulmonary:     Effort: Pulmonary effort is normal.     Breath sounds: Normal breath sounds.  Abdominal:     General: Bowel sounds are normal.     Palpations: Abdomen is soft. There is no hepatomegaly, splenomegaly or mass.     Tenderness: There is no abdominal tenderness.     Hernia: No hernia is present.  Musculoskeletal:     Cervical back: Neck supple.     Right lower leg: No edema.     Left lower leg: No edema.  Skin:    Findings: No rash.  Neurological:     Mental Status: He is alert and oriented to person, place, and time.     Motor: No weakness.  Psychiatric:  Mood and Affect: Mood normal.        Behavior: Behavior normal.     BP 134/74    Pulse 88    Ht 5\' 10"  (1.778 m)    Wt 204 lb 1.6 oz (92.6 kg)    BMI 29.29 kg/m  Wt Readings from Last 3 Encounters:  05/02/20 204 lb 1.6 oz (92.6 kg)  01/27/20 204 lb 8 oz (92.8 kg)  12/08/19 205 lb (93 kg)     Health Maintenance Due  Topic Date Due   Hepatitis C Screening  Never done   HIV Screening  Never done   TETANUS/TDAP  Never done   COLONOSCOPY  Never done   INFLUENZA VACCINE  Never done    There are no preventive care reminders to display for this patient.  Lab Results  Component Value Date   TSH 0.59 11/17/2019   Lab Results  Component Value Date   WBC 7.5 11/17/2019   HGB 15.9 11/17/2019   HCT 46.4 11/17/2019   MCV 95.3 11/17/2019   PLT 218 11/17/2019   Lab Results  Component Value Date   NA 139 11/17/2019   K 4.5  11/17/2019   CO2 18 (L) 11/17/2019   GLUCOSE 100 (H) 11/17/2019   BUN 16 11/17/2019   CREATININE 1.03 11/17/2019   BILITOT 0.8 11/17/2019   AST 16 11/17/2019   ALT 14 11/17/2019   PROT 7.6 11/17/2019   CALCIUM 10.0 11/17/2019   ANIONGAP 6 (L) 07/12/2013   No results found for: CHOL No results found for: HDL No results found for: LDLCALC No results found for: TRIG No results found for: CHOLHDL No results found for: 07/14/2013    Assessment & Plan:   Problem List Items Addressed This Visit      Cardiovascular and Mediastinum   Essential hypertension - Primary    Patient was advised to continue taking ACE inhibitor.  His blood pressure is stable on the present regimen.  He was also advised to follow low-cholesterol diet.      Atherosclerosis    Patient was advised to follow low-cholesterol diet.  Walk on a daily basis take 1 baby aspirin a day.        Respiratory   Seasonal allergic rhinitis    Advised to take Claritin 10 mg p.o. daily.        Other   Tobacco abuse disorder    - I instructed the patient to stop smoking and provided them with smoking cessation materials.  - I informed the patient that smoking puts them at increased risk for cancer, COPD, hypertension, and more.  - Informed the patient to seek help if they begin to have trouble breathing, develop chest pain, start to cough up blood, feel faint, or pass out.           No orders of the defined types were placed in this encounter.   Follow-up: No follow-ups on file.    [id number], MD

## 2020-07-05 ENCOUNTER — Other Ambulatory Visit: Payer: Self-pay | Admitting: *Deleted

## 2020-07-05 MED ORDER — FLUTICASONE PROPIONATE 50 MCG/ACT NA SUSP: 1.0000 | Freq: Two times a day (BID) | NASAL | 9 refills | Status: AC

## 2021-03-03 ENCOUNTER — Other Ambulatory Visit: Payer: Self-pay

## 2021-03-03 ENCOUNTER — Ambulatory Visit
Admission: EM | Admit: 2021-03-03 | Discharge: 2021-03-03 | Disposition: A | Discharge status: Home / Self Care | Payer: Managed Care, Other (non HMO) | Attending: Family Medicine | Admitting: Family Medicine

## 2021-03-03 DIAGNOSIS — R55 Syncope and collapse: Secondary | ICD-10-CM | POA: Diagnosis not present

## 2021-03-03 LAB — COMPREHENSIVE METABOLIC PANEL
ALT: 16 U/L (ref 0–44)
AST: 17 U/L (ref 15–41)
Albumin: 4.3 g/dL (ref 3.5–5.0)
Alkaline Phosphatase: 83 U/L (ref 38–126)
Anion gap: 6 (ref 5–15)
BUN: 13 mg/dL (ref 8–23)
CO2: 28 mmol/L (ref 22–32)
Calcium: 9.4 mg/dL (ref 8.9–10.3)
Chloride: 101 mmol/L (ref 98–111)
Creatinine, Ser: 0.84 mg/dL (ref 0.61–1.24)
GFR, Estimated: >60 mL/min (ref >60)
Glucose, Bld: 102 mg/dL — ABNORMAL HIGH (ref 70–99)
Potassium: 4.1 mmol/L (ref 3.5–5.1)
Sodium: 135 mmol/L (ref 135–145)
Total Bilirubin: 0.7 mg/dL (ref 0.3–1.2)
Total Protein: 7.7 g/dL (ref 6.5–8.1)

## 2021-03-03 LAB — CBC WITH DIFFERENTIAL/PLATELET
Abs Immature Granulocytes: 0.03 10*3/uL (ref 0.00–0.07)
Basophils Absolute: 0.1 10*3/uL (ref 0.0–0.1)
Basophils Relative: 1 %
Eosinophils Absolute: 0.3 10*3/uL (ref 0.0–0.5)
Eosinophils Relative: 3 %
HCT: 43.7 % (ref 39.0–52.0)
Hemoglobin: 15.1 g/dL (ref 13.0–17.0)
Immature Granulocytes: 0 %
Lymphocytes Relative: 28 %
Lymphs Abs: 2.2 10*3/uL (ref 0.7–4.0)
MCH: 32.3 pg (ref 26.0–34.0)
MCHC: 34.6 g/dL (ref 30.0–36.0)
MCV: 93.4 fL (ref 80.0–100.0)
Monocytes Absolute: 0.5 10*3/uL (ref 0.1–1.0)
Monocytes Relative: 7 %
Neutro Abs: 4.8 10*3/uL (ref 1.7–7.7)
Neutrophils Relative %: 61 %
Platelets: 224 10*3/uL (ref 150–400)
RBC: 4.68 MIL/uL (ref 4.22–5.81)
RDW: 12.7 % (ref 11.5–15.5)
WBC: 7.8 10*3/uL (ref 4.0–10.5)
nRBC: 0 % (ref 0.0–0.2)

## 2021-03-03 NOTE — ED Triage Notes (Signed)
Pt c/o having sudden onset of lightheadedness, decreased vision, shakiness and dizziness on Wednesday. Pt states he did pull over at a rest stop and waited it out, states it took about 45 minutes for him to be able to continue driving. Pt denies headache. Pt states he had recently eaten lunch prior to the episode. Pt has no hx of DM or any other major medical conditions. Pt states he has not had any repeat episodes since that time.

## 2021-03-03 NOTE — Discharge Instructions (Signed)
EKG and labs normal.  Stay hydrated.  Continue your home medications.  Follow up with your PCP if this recurs.  Take care  Dr. Adriana Simas

## 2021-03-03 NOTE — ED Provider Notes (Signed)
[facility]    CSN: [id number] Arrival date & time: 03/03/21  1030      History   Chief Complaint Chief Complaint  Patient presents with   Dizziness    HPI  65 year old male presents with the above complaint.  Patient states that on Wednesday he was driving on the highway and had sudden onset feeling of lightheadedness/dizziness.  He states that he felt as if he was going to pass out.  States that he felt nervous and very shaky as well.  He states that he pulled over and subsequently mated to a rest stop and rested for approximately 45 minutes before he felt well enough to drive home.  No headache.  He states that since that time he has felt well.  He has had no further issues or episodes similar to this.  Denies chest pain.  He is currently feeling well.  He is slightly nervous and apprehensive about this occurring again.  He states that it scared him.  No changes in his medications.  His vital signs are currently stable.  No other complaints.  Past Medical History:  Diagnosis Date   Hypertension     Patient Active Problem List   Diagnosis Date Noted   Metabolic syndrome 01/27/2020   Atherosclerosis 01/27/2020   Tobacco abuse disorder 11/13/2019   Upper respiratory tract infection 11/13/2019   Sprain of right foot 11/13/2019   Seasonal allergic rhinitis 11/13/2019   Essential hypertension 11/13/2019   Past Surgical History:  Procedure Laterality Date   LEG AMPUTATION Left    Home Medications    Prior to Admission medications   Medication Sig Start Date End Date Taking? Authorizing Provider  lisinopril (ZESTRIL) 10 MG tablet Take 1 tablet (10 mg total) by mouth daily. 05/16/20  Yes Masoud, Renda Rolls, MD  fluticasone (FLONASE) 50 MCG/ACT nasal spray Place 1 spray into both nostrils in the morning and at bedtime. 07/05/20   Corky Downs, MD   Social History Social History   Tobacco Use   Smoking status: Every Day    Packs/day: 1.00    Years: 42.00    Pack  years: 42.00    Types: Cigarettes   Smokeless tobacco: Never  Vaping Use   Vaping Use: Never used  Substance Use Topics   Alcohol use: Yes   Drug use: No     Allergies   Etodolac   Review of Systems Review of Systems  Constitutional:  Negative for fever.  Gastrointestinal:  Negative for nausea and vomiting.  Neurological:  Positive for dizziness and light-headedness.    Physical Exam Triage Vital Signs ED Triage Vitals  Enc Vitals Group     BP 03/03/21 1052 121/65     Pulse Rate 03/03/21 1052 61     Resp 03/03/21 1052 18     Temp 03/03/21 1052 98.2 F (36.8 C)     Temp Source 03/03/21 1052 Oral     SpO2 03/03/21 1052 98 %     Weight 03/03/21 1050 205 lb (93 kg)     Height 03/03/21 1050 5' 10.5" (1.791 m)     Head Circumference --      Peak Flow --      Pain Score 03/03/21 1050 0     Pain Loc --      Pain Edu? --      Excl. in GC? --     Updated Vital Signs BP 121/65 (BP Location: Left Arm)   Pulse 61   Temp 98.2  F (36.8 C) (Oral)   Resp 18   Ht 5' 10.5" (1.791 m)   Wt 93 kg   SpO2 98%   BMI 29.00 kg/m   Visual Acuity Right Eye Distance:   Left Eye Distance:   Bilateral Distance:    Right Eye Near:   Left Eye Near:    Bilateral Near:     Physical Exam Vitals and nursing note reviewed.  Constitutional:      General: He is not in acute distress.    Appearance: Normal appearance. He is not ill-appearing.  HENT:     Head: Normocephalic and atraumatic.     Right Ear: Tympanic membrane normal.     Left Ear: Tympanic membrane normal.     Mouth/Throat:     Pharynx: Oropharynx is clear.  Eyes:     General:        Right eye: No discharge.        Left eye: No discharge.     Conjunctiva/sclera: Conjunctivae normal.     Pupils: Pupils are equal, round, and reactive to light.  Cardiovascular:     Rate and Rhythm: Normal rate and regular rhythm.     Heart sounds: No murmur heard. Pulmonary:     Effort: Pulmonary effort is normal.     Breath  sounds: Normal breath sounds. No wheezing, rhonchi or rales.  Neurological:     General: No focal deficit present.     Mental Status: He is alert.  Psychiatric:        Mood and Affect: Mood normal.        Behavior: Behavior normal.     UC Treatments / Results  Labs (all labs ordered are listed, but only abnormal results are displayed) Labs Reviewed  COMPREHENSIVE METABOLIC PANEL - Abnormal; Notable for the following components:      Result Value   Glucose, Bld 102 (*)    All other components within normal limits  CBC WITH DIFFERENTIAL/PLATELET    EKG Interpretation: Normal sinus rhythm with a rate of 63.  Normal axis.  Normal intervals.  No ST or T wave changes.  Normal EKG.  Radiology No results found.  Procedures Procedures (including critical care time)  Medications Ordered in UC Medications - No data to display  Initial Impression / Assessment and Plan / UC Course  I have reviewed the triage vital signs and the nursing notes.  Pertinent labs & imaging results that were available during my care of the patient were reviewed by me and considered in my medical decision making (see chart for details).    65 year old male presents with recent episode of presyncope.  He is currently feeling well.  He is currently asymptomatic.  His exam is benign.  EKG normal.  Laboratory studies only notable for slightly elevated blood glucose.  Otherwise unremarkable.  Advised hydration and supportive care.  Follow-up with PCP if this recurs.  Final Clinical Impressions(s) / UC Diagnoses   Final diagnoses:  Near syncope     Discharge Instructions      EKG and labs normal.  Stay hydrated.  Continue your home medications.  Follow up with your PCP if this recurs.  Take care  Dr. Adriana Simas        ED Prescriptions   None    PDMP not reviewed this encounter.   Tommie Sams, DO 03/03/21 1239

## 2021-06-14 ENCOUNTER — Telehealth: Payer: Self-pay | Admitting: Acute Care

## 2021-06-14 NOTE — Telephone Encounter (Signed)
Left detailed message for pt to call back to schedule lung screening CT.

## 2021-06-19 IMAGING — CT CT CHEST LUNG CANCER SCREENING LOW DOSE W/O CM
2 of 5 series · 15 of 40 positions shown, 18 images · non-contrast
Comparison: None.

CLINICAL DATA: Lung cancer screening. Current asymptomatic smoker
with 42 pack-year history

EXAM:
CT CHEST WITHOUT CONTRAST LOW-DOSE FOR LUNG CANCER SCREENING
TECHNIQUE: Multidetector CT imaging of the chest was performed following the
standard protocol without IV contrast.

[Series 3: lung 1.00 · axial · 0.73mm/px · z∈[-1228,-908]mm · 12 of 354 slices shown, 15 images]
[im 17/354  mediastinal]
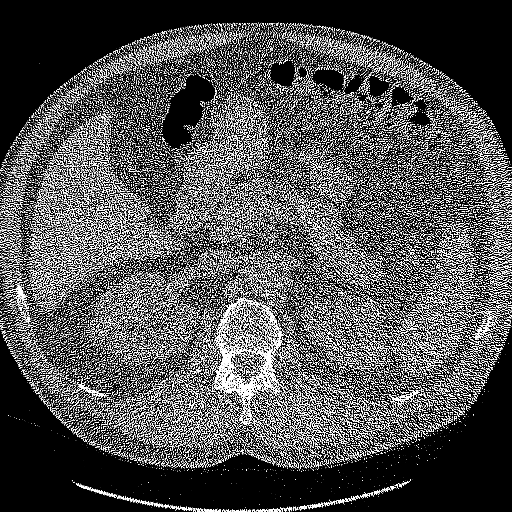
[im 17/354  lung]
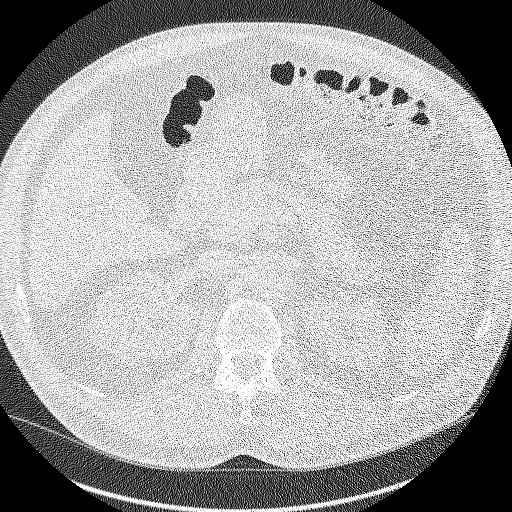
[im 49/354  lung]
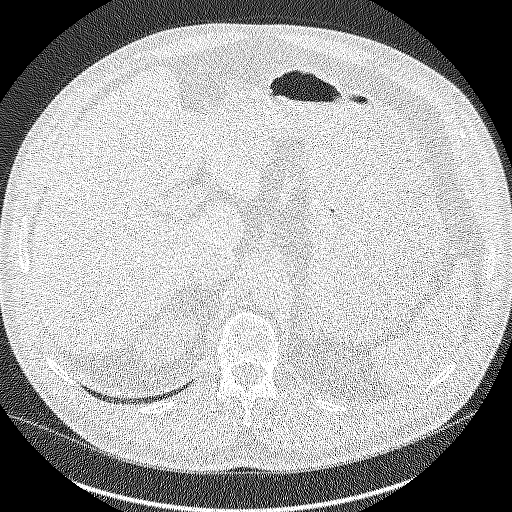
[im 81/354  lung]
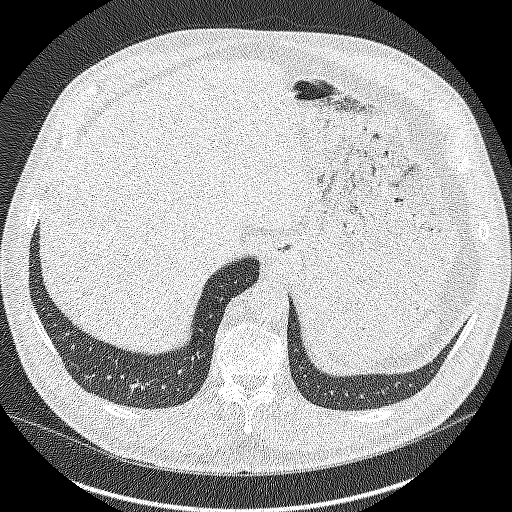
[im 113/354  lung]
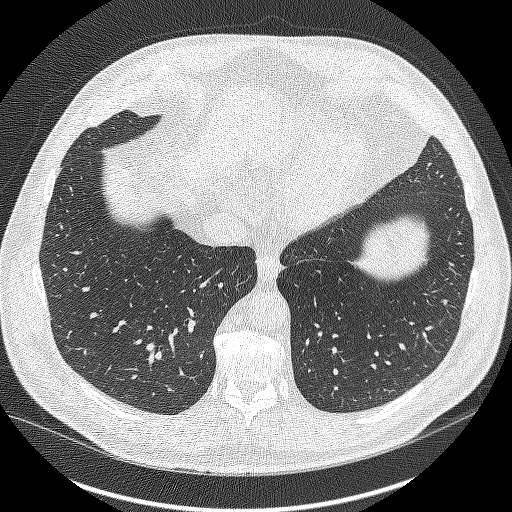
[im 129/354  mediastinal]
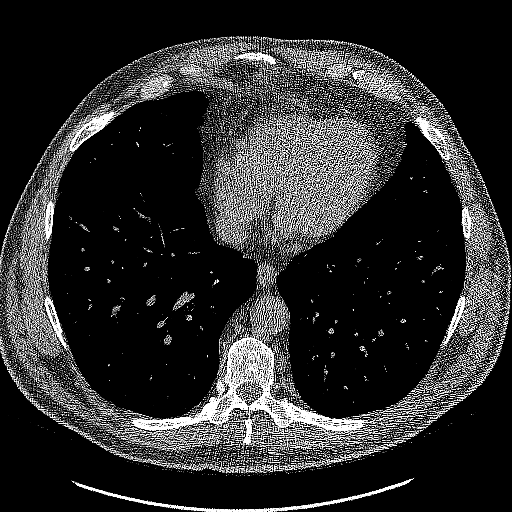
[im 129/354  lung]
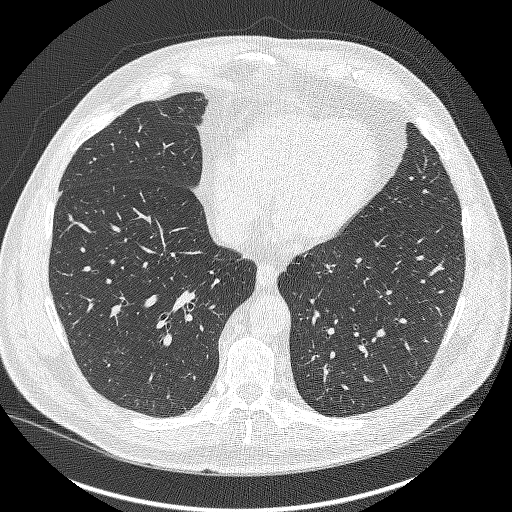
[im 161/354  lung]
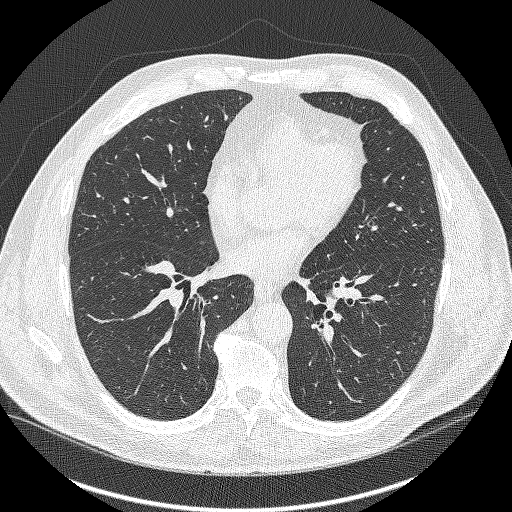
[im 193/354  lung]
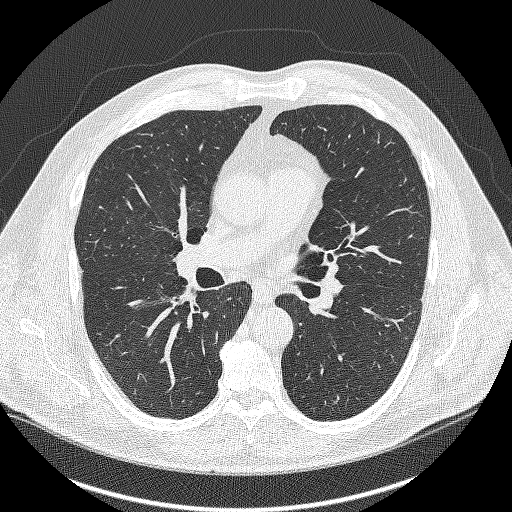
[im 225/354  lung]
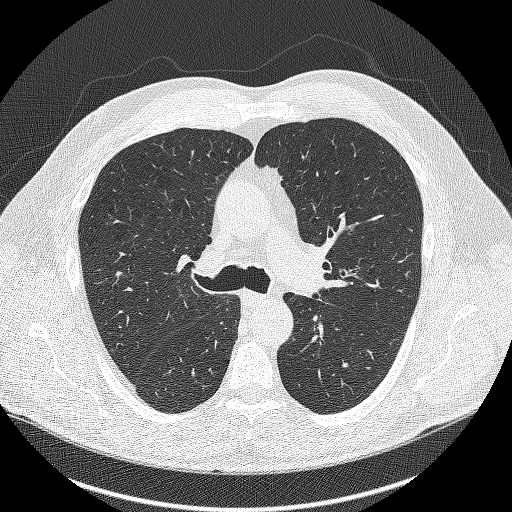
[im 241/354  mediastinal]
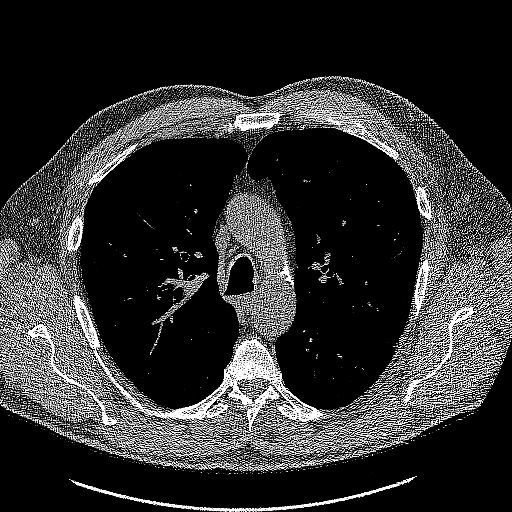
[im 241/354  lung]
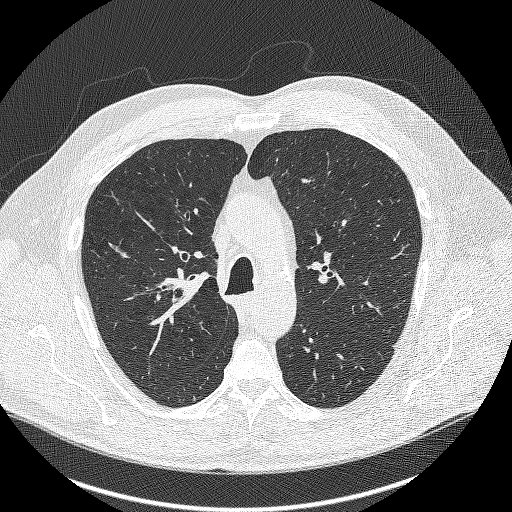
[im 273/354  lung]
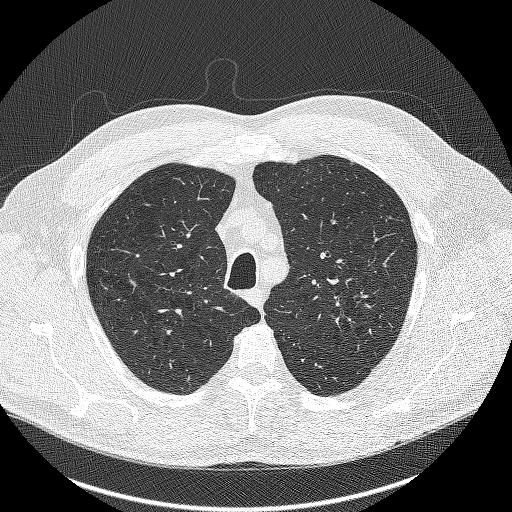
[im 305/354  lung]
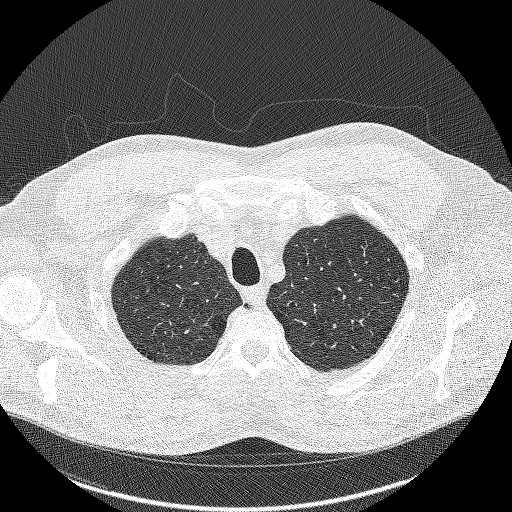
[im 337/354  lung]
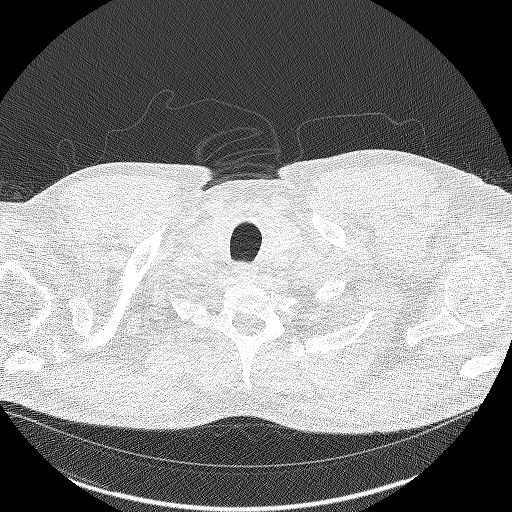

[Series 4: coronals lung 1.00 cor · coronal · 0.69mm/px · 3 of 332 slices shown]
[im 67/332  lung]
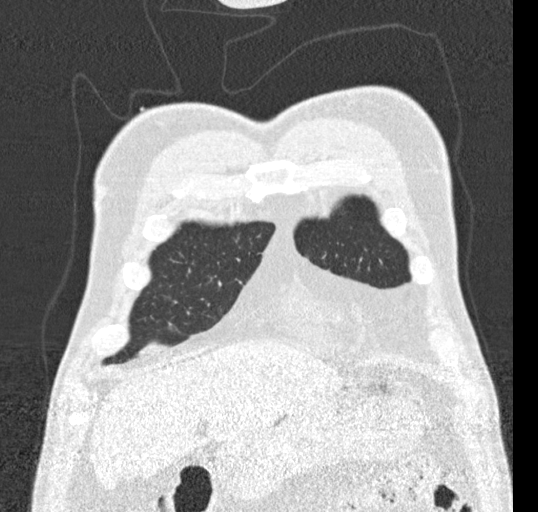
[im 133/332  lung]
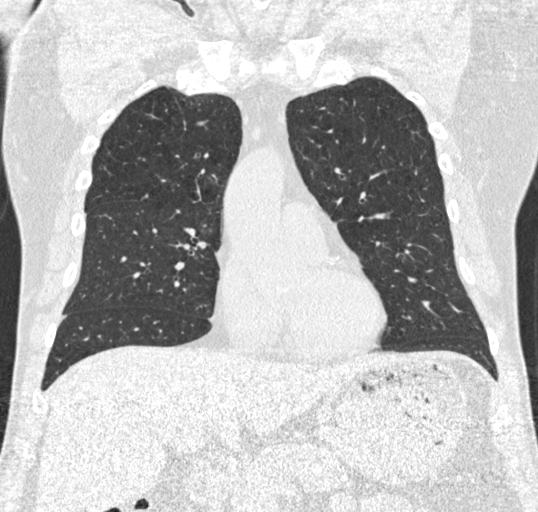
[im 199/332  lung]
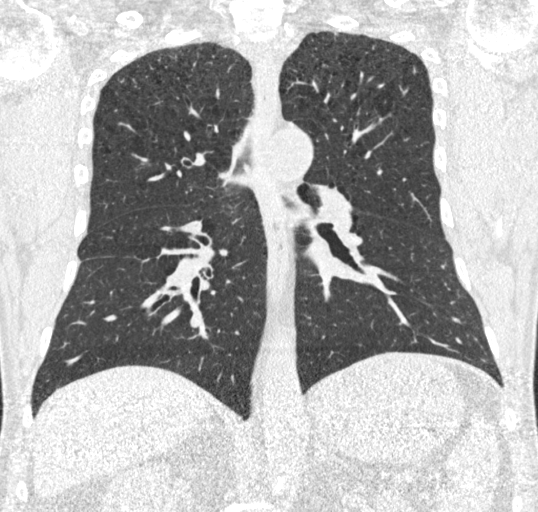

[15 of 40 positions shown; findings below may reference images not displayed]

FINDINGS: Cardiovascular: Normal heart size. Aortic atherosclerosis. Lad
coronary artery calcification.

Mediastinum/Nodes: Partially calcified nodule within inferior pole
of the left lobe of thyroid gland measures 2.6 cm, image 161/4.
Recommend thyroid US (ref: [HOSPITAL]. [DATE]):
143-50). The trachea appears patent and is midline. Normal
appearance of the esophagus. No mediastinal, supraclavicular,
axillary adenopathy.

Lungs/Pleura: Centrilobular and paraseptal emphysema. No pleural
effusion, airspace consolidation, or atelectasis. Pulmonary nodule
within the lingula has an equivalent diameter of 3.9 mm. No
additional lung nodules identified at this time.

Upper Abdomen: No acute abnormality.

Musculoskeletal: No chest wall mass or suspicious bone lesions
identified.
IMPRESSION: 1. Lung-RADS 2, benign appearance or behavior. Continue annual
screening with low-dose chest CT without contrast in 12 months.
2. Emphysema and aortic atherosclerosis. Coronary artery
calcifications noted.

Aortic Atherosclerosis (XI28F-Y6F.F) and Emphysema (XI28F-HP6.R).

## 2021-07-21 ENCOUNTER — Other Ambulatory Visit: Payer: Self-pay | Admitting: *Deleted

## 2021-07-21 MED ORDER — LISINOPRIL 10 MG PO TABS
10.0000 mg | ORAL_TABLET | Freq: Every day | ORAL | 3 refills | Status: AC
Start: 2021-07-21 — End: ?

## 2022-02-27 ENCOUNTER — Telehealth: Payer: Self-pay | Admitting: *Deleted

## 2022-02-27 ENCOUNTER — Encounter: Payer: Self-pay | Admitting: *Deleted

## 2022-02-27 NOTE — Telephone Encounter (Signed)
Attempted to call pt. Voicemail box full To schedule follow up LCS scan. Letter mailed to address on file.

## 2023-02-11 ENCOUNTER — Other Ambulatory Visit: Payer: Self-pay | Admitting: Family Medicine

## 2023-02-11 DIAGNOSIS — F17200 Nicotine dependence, unspecified, uncomplicated: Secondary | ICD-10-CM

## 2023-02-19 ENCOUNTER — Ambulatory Visit
Admission: RE | Admit: 2023-02-19 | Discharge: 2023-02-19 | Disposition: A | Discharge status: Home / Self Care | Payer: Medicare HMO | Source: Ambulatory Visit | Attending: Family Medicine | Admitting: Family Medicine

## 2023-02-19 DIAGNOSIS — I700 Atherosclerosis of aorta: Secondary | ICD-10-CM | POA: Diagnosis not present

## 2023-02-19 DIAGNOSIS — F17210 Nicotine dependence, cigarettes, uncomplicated: Secondary | ICD-10-CM | POA: Diagnosis not present

## 2023-02-19 DIAGNOSIS — Z122 Encounter for screening for malignant neoplasm of respiratory organs: Secondary | ICD-10-CM | POA: Insufficient documentation

## 2023-02-19 DIAGNOSIS — J439 Emphysema, unspecified: Secondary | ICD-10-CM | POA: Diagnosis not present

## 2023-02-19 DIAGNOSIS — E041 Nontoxic single thyroid nodule: Secondary | ICD-10-CM | POA: Insufficient documentation

## 2023-02-19 DIAGNOSIS — F17200 Nicotine dependence, unspecified, uncomplicated: Secondary | ICD-10-CM | POA: Insufficient documentation

## 2023-03-21 ENCOUNTER — Other Ambulatory Visit: Payer: Self-pay | Admitting: Family Medicine

## 2023-03-21 DIAGNOSIS — E041 Nontoxic single thyroid nodule: Secondary | ICD-10-CM

## 2023-03-25 ENCOUNTER — Ambulatory Visit
Admission: RE | Admit: 2023-03-25 | Discharge: 2023-03-25 | Disposition: A | Discharge status: Home / Self Care | Payer: Medicare HMO | Source: Ambulatory Visit | Attending: Family Medicine | Admitting: Family Medicine

## 2023-03-25 DIAGNOSIS — E041 Nontoxic single thyroid nodule: Secondary | ICD-10-CM | POA: Diagnosis present

## 2023-04-11 ENCOUNTER — Other Ambulatory Visit: Payer: Self-pay | Admitting: Unknown Physician Specialty

## 2023-04-11 DIAGNOSIS — E041 Nontoxic single thyroid nodule: Secondary | ICD-10-CM

## 2023-05-02 ENCOUNTER — Ambulatory Visit
Admission: RE | Admit: 2023-05-02 | Discharge: 2023-05-02 | Disposition: A | Discharge status: Home / Self Care | Payer: Medicare HMO | Source: Ambulatory Visit | Attending: Unknown Physician Specialty | Admitting: Unknown Physician Specialty

## 2023-05-02 ENCOUNTER — Other Ambulatory Visit (HOSPITAL_COMMUNITY)
Admission: RE | Admit: 2023-05-02 | Discharge: 2023-05-02 | Disposition: A | Discharge status: Home / Self Care | Payer: Medicare HMO | Source: Ambulatory Visit | Attending: Unknown Physician Specialty | Admitting: Unknown Physician Specialty

## 2023-05-02 DIAGNOSIS — E041 Nontoxic single thyroid nodule: Secondary | ICD-10-CM | POA: Insufficient documentation

## 2023-05-06 LAB — CYTOLOGY - NON PAP

## 2024-02-07 ENCOUNTER — Ambulatory Visit
Admission: RE | Admit: 2024-02-07 | Discharge: 2024-02-07 | Disposition: A | Discharge status: Home / Self Care | Source: Ambulatory Visit | Attending: Family Medicine | Admitting: Family Medicine

## 2024-02-07 ENCOUNTER — Other Ambulatory Visit: Payer: Self-pay | Admitting: Family Medicine

## 2024-02-07 ENCOUNTER — Ambulatory Visit
Admission: RE | Admit: 2024-02-07 | Discharge: 2024-02-07 | Disposition: A | Discharge status: Home / Self Care | Attending: Family Medicine | Admitting: Family Medicine

## 2024-02-07 ENCOUNTER — Encounter: Payer: Self-pay | Admitting: Family Medicine

## 2024-02-07 DIAGNOSIS — M79601 Pain in right arm: Secondary | ICD-10-CM

## 2024-02-11 ENCOUNTER — Other Ambulatory Visit: Payer: Self-pay | Admitting: Family Medicine

## 2024-02-11 DIAGNOSIS — M79601 Pain in right arm: Secondary | ICD-10-CM

## 2024-06-03 ENCOUNTER — Encounter: Payer: Self-pay | Admitting: Physical Therapy

## 2024-06-03 ENCOUNTER — Ambulatory Visit: Attending: Family Medicine | Admitting: Physical Therapy

## 2024-06-03 DIAGNOSIS — M25611 Stiffness of right shoulder, not elsewhere classified: Secondary | ICD-10-CM | POA: Insufficient documentation

## 2024-06-03 DIAGNOSIS — M25511 Pain in right shoulder: Secondary | ICD-10-CM | POA: Diagnosis present

## 2024-06-03 DIAGNOSIS — G8929 Other chronic pain: Secondary | ICD-10-CM | POA: Diagnosis present

## 2024-06-03 DIAGNOSIS — M6281 Muscle weakness (generalized): Secondary | ICD-10-CM | POA: Diagnosis present

## 2024-06-06 NOTE — Therapy (Signed)
 " OUTPATIENT PHYSICAL THERAPY SHOULDER EVALUATION   Patient Name: Roger Simpson MRN: [id number] DOB:Aug 09, 1955, 69 y.o., male Today's Date: 06/03/2024  END OF SESSION:  PT End of Session - 06/06/24 1916     Visit Number 1    Number of Visits 8    Date for Recertification  07/01/24    PT Start Time 1026    PT Stop Time 1114    PT Time Calculation (min) 48 min          Past Medical History:  Diagnosis Date   Hypertension    Past Surgical History:  Procedure Laterality Date   LEG AMPUTATION Left    Patient Active Problem List   Diagnosis Date Noted   Metabolic syndrome 01/27/2020   Atherosclerosis 01/27/2020   Tobacco abuse disorder 11/13/2019   Upper respiratory tract infection 11/13/2019   Sprain of right foot 11/13/2019   Seasonal allergic rhinitis 11/13/2019   Essential hypertension 11/13/2019    PCP: Odell Tor Edra CINDERELLA, MD  REFERRING PROVIDER: Odell Tor, Edra CINDERELLA, MD  REFERRING DIAG:  Diagnosis  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 Reports >1 year R shoulder pain with no MOI.  Pt. States initial corticosteroid use decreased pain for a couple weeks but pain has returned.  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  Pt. Reports no clicking or popping in R shoulder with overhead reaching.  Pt. Reports 2/10 R shoulder pain and describes pain as hot nail and sharp pain which resolves quick.   Hand dominance: Right  PERTINENT HISTORY: See MD notes.  S/p L AKA since 1977.     PAIN:  Are you having pain? Yes: NPRS scale: 2/10 Pain location: R shoulder Pain description: sharp/ hot nail Aggravating factors: lifting/ end-range Relieving factors: rest  PRECAUTIONS: None  RED FLAGS: None   WEIGHT BEARING RESTRICTIONS: No  FALLS:  Has patient fallen in last 6 months? No  LIVING ENVIRONMENT: Lives with: lives with their spouse Lives in: House/apartment Has following equipment at home: None  OCCUPATION: Product/process development scientist  PLOF: Independent  PATIENT GOALS:  Decrease pain in R shoulder with all tasks, working/ golfing.    NEXT MD VISIT:  OBJECTIVE:  Note: Objective measures were completed at Evaluation unless otherwise noted.  DIAGNOSTIC FINDINGS:  N/A  PATIENT SURVEYS:  QuickDASH: 11.4%  COGNITION: Overall cognitive status: Within functional limits for tasks assessed     SENSATION: WFL  POSTURE: Slight rounded shoulder posture in sitting  UPPER EXTREMITY ROM:   Active ROM Right eval Left eval  Shoulder flexion WNL WNL  Shoulder extension    Shoulder abduction WNL (pain) WNL  Shoulder adduction    Shoulder internal rotation    Shoulder external rotation    Elbow flexion WNL WNL  Elbow extension WNL WNL  Wrist flexion    Wrist extension    Wrist ulnar deviation    Wrist radial deviation    Wrist pronation    Wrist supination    (Blank rows = not tested)  Cervical AROM: flexion 55 deg./ extension 35 deg./ R lat. Flex. 35 deg./ L lat. Flex. 25 deg./ R rotn. 65 deg./ L rotn. 49 deg.    UPPER EXTREMITY MMT:  MMT Right eval Left eval  Shoulder flexion 5 5  Shoulder extension    Shoulder abduction 4+ (pain) 5  Shoulder adduction    Shoulder internal rotation 5 5  Shoulder external rotation 5 5  Middle trapezius    Lower trapezius    Elbow flexion 5 5  Elbow extension 5 5  Wrist flexion    Wrist extension    Wrist ulnar deviation    Wrist radial deviation    Wrist pronation    Wrist supination    Grip  strength (lbs) 81.5# 94.3#  (Blank rows = not tested)  SHOULDER SPECIAL TESTS: Impingement tests: Neer impingement test: negative and Hawkins/Kennedy impingement test: negative SLAP lesions: NT Instability tests: Posterior drawer test: negative and Sulcus sign: negative Rotator cuff assessment: Empty can test: negative, Full can test: negative, and Belly press test: positive  Biceps assessment: NT  JOINT MOBILITY TESTING:  Slight R shoulder hypomobility with PA mobs  PALPATION:  Minimal tenderness along mid-posterior deltoid.  No swelling or bruising noted.                                                                                                                              TREATMENT DATE: 06/03/24  See evaluation  PATIENT EDUCATION: Education details: R shoulder pain/ modalities/ [employer] ex. Program).   Person educated: Patient Education method: Explanation Education comprehension: verbalized understanding and returned demonstration  HOME EXERCISE PROGRAM: Will issue next tx. Session.    ASSESSMENT:  CLINICAL IMPRESSION: Patient is a pleasant 69 y.o. male who was seen today for physical therapy evaluation and treatment for chronic R shoulder pain.   Pt. Presents with good R shoulder AROM with increase pain at end-range abduction.  B UE muscle strength grossly 5/5 MMT except R shoulder abduction 4+/5 MMT (pain).  Pt. Hoping to attend [employer] and golf with no R shoulder pain/ limitations.  Pt. QuickDASH is  a low 11.4%.  Pt. Will benefit from skilled PT services to increase R shoulder ROM/ strength to WNL as compared to L with no pain or limitations to improve mobility.    OBJECTIVE IMPAIRMENTS: decreased activity tolerance, decreased ROM, decreased strength, impaired flexibility, postural dysfunction, and pain.   ACTIVITY LIMITATIONS: carrying, lifting, reach over head, and locomotion level  PARTICIPATION LIMITATIONS: community activity,  occupation, and yard work  PERSONAL FACTORS: Fitness and Past/current experiences are also affecting patient's functional outcome.   REHAB POTENTIAL: Good  CLINICAL DECISION MAKING: Stable/uncomplicated  EVALUATION COMPLEXITY: Low   GOALS: Goals reviewed with patient? Yes  SHORT TERM GOALS: Target date: 06/17/24  Pt. Independent with HEP to increase R shoulder abduction strength 1/2 muscle grade to improve pain-free mobility. Baseline:  R shoulder abduction 4+/5 (marked pain) Goal status: INITIAL  2.  Pt. Will increase cervical AROM (L lat. Flex./ L rotn.) to WNL as compared to R to improve posture/ R shoulder mobility with functional tasks.   Baseline: L lat. Flex. 25 deg./ R rotn. 65 deg./ L rotn. 49 deg.   Goal status: INITIAL  LONG TERM GOALS: Target date: 07/01/24  Pt. Will decrese QuickDASH to <5% to improve pain-free mobility.   Baseline: 11.4% Goal status: INITIAL  2.  Pt. Able to complete 45 minute gym based exercise program [employer]) with no R shoulder pain or limitations.   Baseline: pt. Planning to join gym Goal status: INITIAL  3.  Pt. Able to golf with no R shoulder pain or limitations to improve pain-free mobility.     Baseline: R shoulder pain Goal status: INITIAL   PLAN:  PT FREQUENCY: 2x/week  PT DURATION: 4 weeks  PLANNED INTERVENTIONS: 97110-Therapeutic exercises, 97530- Therapeutic activity, [id number]- Neuromuscular re-education, 97535- Self Care, [id number]- Manual therapy, [id number]- Electrical stimulation (unattended), 20560 (1-2 muscles), 20561 (3+ muscles)- Dry Needling, Patient/Family education, Taping, Joint mobilization, Cryotherapy, and Moist heat  PLAN FOR NEXT SESSION: Check mid-low trap/ issue HEP  Roger Simpson, PT, DPT # [phone] 06/06/2024, 7:17 PM  "

## 2024-06-08 NOTE — Addendum Note (Signed)
 Addended by: ILEENE OZELL BROCKS on: 06/08/2024 06:41 PM   Modules accepted: Orders

## 2024-06-09 ENCOUNTER — Ambulatory Visit: Admitting: Physical Therapy

## 2024-06-09 DIAGNOSIS — M6281 Muscle weakness (generalized): Secondary | ICD-10-CM

## 2024-06-09 DIAGNOSIS — G8929 Other chronic pain: Secondary | ICD-10-CM

## 2024-06-09 DIAGNOSIS — M25511 Pain in right shoulder: Secondary | ICD-10-CM | POA: Diagnosis not present

## 2024-06-09 DIAGNOSIS — M25611 Stiffness of right shoulder, not elsewhere classified: Secondary | ICD-10-CM

## 2024-06-09 NOTE — Therapy (Signed)
 " OUTPATIENT PHYSICAL THERAPY SHOULDER TREATMENT   Patient Name: Roger Simpson MRN: [id number] DOB:1956/03/20, 69 y.o., male Today's Date: 06/09/2024  END OF SESSION:  PT End of Session - 06/09/24 1117     Visit Number 2    Number of Visits 8    Date for Recertification  07/01/24    PT Start Time 1113    PT Stop Time 1205    PT Time Calculation (min) 52 min    Activity Tolerance Patient tolerated treatment well;Patient limited by pain          Past Medical History:  Diagnosis Date   Hypertension    Past Surgical History:  Procedure Laterality Date   LEG AMPUTATION Left    Patient Active Problem List   Diagnosis Date Noted   Metabolic syndrome 01/27/2020   Atherosclerosis 01/27/2020   Tobacco abuse disorder 11/13/2019   Upper respiratory tract infection 11/13/2019   Sprain of right foot 11/13/2019   Seasonal allergic rhinitis 11/13/2019   Essential hypertension 11/13/2019   PCP: Odell Tor Edra CINDERELLA, MD  REFERRING PROVIDER: Odell Tor, Edra CINDERELLA, MD  REFERRING DIAG:  Diagnosis  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 Reports >1 year R shoulder pain with no MOI.  Pt. States initial corticosteroid use decreased pain for a couple weeks but pain has returned.  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  Pt. Reports no clicking or popping in R shoulder with overhead reaching.  Pt. Reports 2/10 R shoulder pain and describes pain as hot nail and sharp pain which resolves quick.   Hand  dominance: Right  PERTINENT HISTORY: See MD notes.  S/p L AKA since 1977.    PAIN:  Are you having pain? Yes: NPRS scale: 2/10 Pain location: R shoulder Pain description: sharp/ hot nail Aggravating factors: lifting/ end-range Relieving factors: rest  PRECAUTIONS: None  RED FLAGS: None   WEIGHT BEARING RESTRICTIONS: No  FALLS:  Has patient fallen in last 6 months? No  LIVING ENVIRONMENT: Lives with: lives with their spouse Lives in: House/apartment Has following equipment at home: None  OCCUPATION: Product/process development scientist  PLOF: Independent  PATIENT GOALS:  Decrease pain in R shoulder with all  tasks, working/ golfing.    NEXT MD VISIT:   OBJECTIVE:  Note: Objective measures were completed at Evaluation unless otherwise noted.  DIAGNOSTIC FINDINGS:  N/A  PATIENT SURVEYS:  QuickDASH: 11.4%  COGNITION: Overall cognitive status: Within functional limits for tasks assessed     SENSATION: WFL  POSTURE: Slight rounded shoulder posture in sitting  UPPER EXTREMITY ROM:   Active ROM Right eval Left eval  Shoulder flexion WNL WNL  Shoulder extension    Shoulder abduction WNL (pain) WNL  Shoulder adduction    Shoulder internal rotation    Shoulder external rotation    Elbow flexion WNL WNL  Elbow extension WNL WNL  Wrist flexion    Wrist extension    Wrist ulnar deviation    Wrist radial deviation    Wrist pronation    Wrist supination    (Blank rows = not tested)  Cervical AROM: flexion 55 deg./ extension 35 deg./ R lat. Flex. 35 deg./ L lat. Flex. 25 deg./ R rotn. 65 deg./ L rotn. 49 deg.    UPPER EXTREMITY MMT:  MMT Right eval Left eval  Shoulder flexion 5 5  Shoulder extension    Shoulder abduction 4+ (pain) 5  Shoulder adduction    Shoulder internal rotation 5 5  Shoulder external rotation 5 5  Middle trapezius    Lower trapezius    Elbow flexion 5 5  Elbow extension 5 5  Wrist flexion    Wrist extension    Wrist ulnar deviation     Wrist radial deviation    Wrist pronation    Wrist supination    Grip strength (lbs) 81.5# 94.3#  (Blank rows = not tested)  SHOULDER SPECIAL TESTS: Impingement tests: Neer impingement test: negative and Hawkins/Kennedy impingement test: negative SLAP lesions: NT Instability tests: Posterior drawer test: negative and Sulcus sign: negative Rotator cuff assessment: Empty can test: negative, Full can test: negative, and Belly press test: positive  Biceps assessment: NT  JOINT MOBILITY TESTING:  Slight R shoulder hypomobility with PA mobs  PALPATION:  Minimal tenderness along mid-posterior deltoid.  No swelling or bruising noted.                                                                                                                              TREATMENT DATE: 06/09/2024  Subjective: Pt. Feels pretty good day.   Pt. Reports felling sore after last session but resolved the following day. No c/o clicking or popping in R shoulder.   Pt. Is excited to start at [employer] in about a week and is currently having a 90 min deep tissue massage every 1 month.    B UBE 3 min. F/b (warm-up/ discussed weekend activities).    Therapeutic activity.  Seated GTB, progressing to BTB bilateral ER 1x15   Standing BTB scapular retractions with mirror feedback. 2x15  Standing shoulder Abduction in pain free range 1lb 1x10 reps  See HEP  Manual therapy  Supine R shoulder distraction/AP mobs/ STM/ Pec stretch for pain relief and joint mobility.    Ice pack on shoulder at end of tx for 10 min while educating HEP.    PATIENT EDUCATION: Education details: R shoulder pain/ modalities/ Location Manager ex. Program).   Person educated: Patient Education method: Explanation Education comprehension: verbalized understanding and returned demonstration  HOME EXERCISE PROGRAM: Access Code: [email or web address] URL: [email or web address] Date: 06/09/2024 Prepared by:  Ozell Sero  Exercises - Scapular Retraction with Resistance  - 1 x daily - 4-5 x weekly - 2 sets - 10 reps - Shoulder External Rotation and Scapular Retraction with Resistance  - 1 x daily - 4-5 x weekly - 2 sets - 10 reps - Standing Single Arm Shoulder Abduction with Dumbbell - Thumb Up  - 1 x daily - 4-5 x weekly - 2 sets - 10 reps - Prone Single Arm Shoulder Y  - 1 x daily - 4-5 x weekly - 2 sets - 10 reps   ASSESSMENT:  CLINICAL IMPRESSION: Patient is motivated to complete PT and is excited to get a gym based exercise program. No increase c/o R shoulder pain with banded exercises. Pt had pain at end range abduction and PT modified ex.  See new HEP and pt. Will issue a gym based ex. Once pt. Joins [employer].  Pt has weakness in R mid and low trap with a 4 and 3- respectively. Pt. Also had a + IR lag sign with pain. Pt will benefit skilled physical therapy for ROM and strengthening and pain relief.     OBJECTIVE IMPAIRMENTS: decreased activity tolerance, decreased ROM, decreased strength, impaired flexibility, postural dysfunction, and pain.   ACTIVITY LIMITATIONS: carrying, lifting, reach over head, and locomotion level  PARTICIPATION LIMITATIONS: community activity, occupation, and yard work  PERSONAL FACTORS: Fitness and Past/current experiences are also affecting patient's functional outcome.   REHAB POTENTIAL: Good  CLINICAL DECISION MAKING: Stable/uncomplicated  EVALUATION COMPLEXITY: Low   GOALS: Goals reviewed with patient? Yes  SHORT TERM GOALS: Target date: 06/17/24  Pt. Independent with HEP to increase R shoulder abduction strength 1/2 muscle grade to improve pain-free mobility. Baseline:  R shoulder abduction 4+/5 (marked pain) Goal status: INITIAL  2.  Pt. Will increase cervical AROM (L lat. Flex./ L rotn.) to WNL as compared to R to improve posture/ R shoulder mobility with functional tasks.   Baseline: L lat. Flex. 25 deg./ R rotn. 65 deg./ L rotn. 49  deg.   Goal status: INITIAL  LONG TERM GOALS: Target date: 07/01/24  Pt. Will decrese QuickDASH to <5% to improve pain-free mobility.   Baseline: 11.4% Goal status: INITIAL  2.  Pt. Able to complete 45 minute gym based exercise program [employer]) with no R shoulder pain or limitations.   Baseline: pt. Planning to join gym Goal status: INITIAL  3.  Pt. Able to golf with no R shoulder pain or limitations to improve pain-free mobility.     Baseline: R shoulder pain Goal status: INITIAL   PLAN:  PT FREQUENCY: 2x/week  PT DURATION: 4 weeks  PLANNED INTERVENTIONS: 97110-Therapeutic exercises, 97530- Therapeutic activity, 97112- Neuromuscular re-education, 97535- Self Care, 02859- Manual therapy, G0283- Electrical stimulation (unattended), 20560 (1-2 muscles), 20561 (3+ muscles)- Dry Needling, Patient/Family education, Taping, Joint mobilization, Cryotherapy, and Moist heat  PLAN FOR NEXT SESSION: Strengthen mid/ low trap Provide a gym based exercise program for non-PT days at [employer] assuming he is able to acquire a membership.  Michael C  Ileene, PT, DPT # [phone] Rankin Gainer, SPT. 06/09/2024, 2:47 PM  "

## 2024-06-11 ENCOUNTER — Encounter: Payer: Self-pay | Admitting: Physical Therapy

## 2024-06-11 ENCOUNTER — Ambulatory Visit: Admitting: Physical Therapy

## 2024-06-11 DIAGNOSIS — M25611 Stiffness of right shoulder, not elsewhere classified: Secondary | ICD-10-CM

## 2024-06-11 DIAGNOSIS — G8929 Other chronic pain: Secondary | ICD-10-CM

## 2024-06-11 DIAGNOSIS — M6281 Muscle weakness (generalized): Secondary | ICD-10-CM

## 2024-06-11 DIAGNOSIS — M25511 Pain in right shoulder: Secondary | ICD-10-CM | POA: Diagnosis not present

## 2024-06-11 NOTE — Therapy (Signed)
 " OUTPATIENT PHYSICAL THERAPY SHOULDER TREATMENT   Patient Name: Roger Simpson MRN: [id number] DOB:[birth year], 69 y.o., male Today's Date: 06/11/2024  END OF SESSION:  PT End of Session - 06/11/24 1116     Visit Number 3    Number of Visits 8    Date for Recertification  07/01/24    PT Start Time 1115    PT Stop Time 1203    PT Time Calculation (min) 48 min    Activity Tolerance Patient tolerated treatment well;Patient limited by pain          Past Medical History:  Diagnosis Date   Hypertension    Past Surgical History:  Procedure Laterality Date   LEG AMPUTATION Left    Patient Active Problem List   Diagnosis Date Noted   Metabolic syndrome 01/27/2020   Atherosclerosis 01/27/2020   Tobacco abuse disorder 11/13/2019   Upper respiratory tract infection 11/13/2019   Sprain of right foot 11/13/2019   Seasonal allergic rhinitis 11/13/2019   Essential hypertension 11/13/2019   PCP: Odell Tor Edra CINDERELLA, MD  REFERRING PROVIDER: Odell Tor, Edra CINDERELLA, MD  REFERRING DIAG:  Diagnosis  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 Reports >1 year R shoulder pain with no MOI.  Pt. States initial corticosteroid use decreased pain for a couple weeks but pain has returned.  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  Pt. Reports no clicking or popping in R shoulder with overhead reaching.  Pt. Reports 2/10 R shoulder pain and describes pain as hot nail and sharp pain which resolves quick.   Hand  dominance: Right  PERTINENT HISTORY: See MD notes.  S/p L AKA since 1977.    PAIN:  Are you having pain? Yes: NPRS scale: 2/10 Pain location: R shoulder Pain description: sharp/ hot nail Aggravating factors: lifting/ end-range Relieving factors: rest  PRECAUTIONS: None  RED FLAGS: None   WEIGHT BEARING RESTRICTIONS: No  FALLS:  Has patient fallen in last 6 months? No  LIVING ENVIRONMENT: Lives with: lives with their spouse Lives in: House/apartment Has following equipment at home: None  OCCUPATION: Product/process development scientist  PLOF: Independent  PATIENT GOALS:  Decrease pain in R shoulder with all  tasks, working/ golfing.    NEXT MD VISIT:   OBJECTIVE:  Note: Objective measures were completed at Evaluation unless otherwise noted.  DIAGNOSTIC FINDINGS:  N/A  PATIENT SURVEYS:  QuickDASH: 11.4%  COGNITION: Overall cognitive status: Within functional limits for tasks assessed     SENSATION: WFL  POSTURE: Slight rounded shoulder posture in sitting  UPPER EXTREMITY ROM:   Active ROM Right eval Left eval  Shoulder flexion WNL WNL  Shoulder extension    Shoulder abduction WNL (pain) WNL  Shoulder adduction    Shoulder internal rotation    Shoulder external rotation    Elbow flexion WNL WNL  Elbow extension WNL WNL  Wrist flexion    Wrist extension    Wrist ulnar deviation    Wrist radial deviation    Wrist pronation    Wrist supination    (Blank rows = not tested)  Cervical AROM: flexion 55 deg./ extension 35 deg./ R lat. Flex. 35 deg./ L lat. Flex. 25 deg./ R rotn. 65 deg./ L rotn. 49 deg.    UPPER EXTREMITY MMT:  MMT Right eval Left eval  Shoulder flexion 5 5  Shoulder extension    Shoulder abduction 4+ (pain) 5  Shoulder adduction    Shoulder internal rotation 5 5  Shoulder external rotation 5 5  Middle trapezius    Lower trapezius    Elbow flexion 5 5  Elbow extension 5 5  Wrist flexion    Wrist extension    Wrist ulnar deviation     Wrist radial deviation    Wrist pronation    Wrist supination    Grip strength (lbs) 81.5# 94.3#  (Blank rows = not tested)  SHOULDER SPECIAL TESTS: Impingement tests: Neer impingement test: negative and Hawkins/Kennedy impingement test: negative SLAP lesions: NT Instability tests: Posterior drawer test: negative and Sulcus sign: negative Rotator cuff assessment: Empty can test: negative, Full can test: negative, and Belly press test: positive  Biceps assessment: NT  JOINT MOBILITY TESTING:  Slight R shoulder hypomobility with PA mobs  PALPATION:  Minimal tenderness along mid-posterior deltoid.  No swelling or bruising noted.                                                                                                                              TREATMENT DATE: 06/11/2024  Subjective: Pt. Feels pretty good today and reports no pain.  Pt. Reports felling good after last session with no soreness.No c/o clicking or popping in R shoulder. Pt. Is excited to start at [employer] and is still working to get a membership figured out.   B UBE 3 min. F/b (warm-up/ discussed weekend activities).    Therapeutic activity.  Standing BTB scapular retractions with mirror feedback. 2x15  Standing Lat pull downs 40# x10 then 60# x10.  Nautilus: Standing chest press 60# 2x10, Eccentric B UE Cable fly 30 # x10, Right arm crossover to left hip 30# x10. (Focusing on slow and  controlled movement)   Supine serratus punches 2x10  Rhythmic stabilization of shoulder in supine. 3x30sec  Manual therapy   Supine R shoulder distraction/AP mobs/ STM/ Pec stretch for pain relief and joint mobility.    PATIENT EDUCATION: Education details: R shoulder pain/ modalities/ Location Manager ex. Program).   Person educated: Patient Education method: Explanation Education comprehension: verbalized understanding and returned demonstration  HOME EXERCISE PROGRAM: Access Code:  [email or web address] URL: [email or web address] Date: 06/09/2024 Prepared by: Ozell Sero  Exercises - Scapular Retraction with Resistance  - 1 x daily - 4-5 x weekly - 2 sets - 10 reps - Shoulder External Rotation and Scapular Retraction with Resistance  - 1 x daily - 4-5 x weekly - 2 sets - 10 reps - Standing Single Arm Shoulder Abduction with Dumbbell - Thumb Up  - 1 x daily - 4-5 x weekly - 2 sets - 10 reps - Prone Single Arm Shoulder Y  - 1 x daily - 4-5 x weekly - 2 sets - 10 reps   ASSESSMENT:  CLINICAL IMPRESSION: Patient is motivated to complete PT and is excited to get a gym based exercise program. Pt came in with no pain or recent concerns for his shoulder. Pt had pain at end range abduction and PT modified exercise. Patient did well with more gym based exercises with the Nautilus.  Will issue a gym based ex. Once pt. Joins [employer].  Pt will benefit skilled physical therapy for ROM and strengthening and pain relief.     OBJECTIVE IMPAIRMENTS: decreased activity tolerance, decreased ROM, decreased strength, impaired flexibility, postural dysfunction, and pain.   ACTIVITY LIMITATIONS: carrying, lifting, reach over head, and locomotion level  PARTICIPATION LIMITATIONS: community activity, occupation, and yard work  PERSONAL FACTORS: Fitness and Past/current experiences are also affecting patient's functional outcome.   REHAB POTENTIAL: Good  CLINICAL DECISION MAKING: Stable/uncomplicated  EVALUATION COMPLEXITY: Low   GOALS: Goals reviewed with patient? Yes  SHORT TERM GOALS: Target date: 06/17/24  Pt. Independent with HEP to increase R shoulder abduction strength 1/2 muscle grade to improve pain-free mobility. Baseline:  R shoulder abduction 4+/5 (marked pain) Goal status: INITIAL  2.  Pt. Will increase cervical AROM (L lat. Flex./ L rotn.) to WNL as compared to R to improve posture/ R shoulder mobility with functional tasks.   Baseline: L lat. Flex. 25  deg./ R rotn. 65 deg./ L rotn. 49 deg.   Goal status: INITIAL  LONG TERM GOALS: Target date: 07/01/24  Pt. Will decrese QuickDASH to <5% to improve pain-free mobility.   Baseline: 11.4% Goal status: INITIAL  2.  Pt. Able to complete 45 minute gym based exercise program [employer]) with no R shoulder pain or limitations.   Baseline: pt. Planning to join gym Goal status: INITIAL  3.  Pt. Able to golf with no R shoulder pain or limitations to improve pain-free mobility.     Baseline: R shoulder pain Goal status: INITIAL   PLAN:  PT FREQUENCY: 2x/week  PT DURATION: 4 weeks  PLANNED INTERVENTIONS: 97110-Therapeutic exercises, 97530- Therapeutic activity, 97112- Neuromuscular re-education, 97535- Self Care, 02859- Manual therapy, G0283- Electrical stimulation (unattended), 20560 (1-2 muscles), 20561 (3+ muscles)- Dry Needling, Patient/Family education, Taping, Joint mobilization, Cryotherapy, and Moist heat  PLAN FOR NEXT SESSION: Strengthen mid/ low trap Provide a gym based exercise program for non-PT days at [employer] assuming he is able to acquire a membership.   CHECK GOALS  Ozell JAYSON Sero, PT, DPT # [phone] Roger Simpson, SPT.  06/11/2024, 3:01 PM  "

## 2024-06-16 ENCOUNTER — Ambulatory Visit: Admitting: Physical Therapy

## 2024-06-16 DIAGNOSIS — M25511 Pain in right shoulder: Secondary | ICD-10-CM | POA: Diagnosis not present

## 2024-06-16 DIAGNOSIS — G8929 Other chronic pain: Secondary | ICD-10-CM

## 2024-06-16 DIAGNOSIS — M25611 Stiffness of right shoulder, not elsewhere classified: Secondary | ICD-10-CM

## 2024-06-16 DIAGNOSIS — M6281 Muscle weakness (generalized): Secondary | ICD-10-CM

## 2024-06-16 NOTE — Therapy (Signed)
 " OUTPATIENT PHYSICAL THERAPY SHOULDER TREATMENT   Patient Name: Roger Simpson MRN: [id number] DOB:August 08, 1955, 69 y.o., male Today's Date: 06/16/2024  END OF SESSION:  PT End of Session - 06/16/24 1120     Visit Number 4    Number of Visits 8    Date for Recertification  07/01/24    PT Start Time 1120    PT Stop Time 1202    PT Time Calculation (min) 42 min    Activity Tolerance Patient tolerated treatment well;Patient limited by pain          Past Medical History:  Diagnosis Date   Hypertension    Past Surgical History:  Procedure Laterality Date   LEG AMPUTATION Left    Patient Active Problem List   Diagnosis Date Noted   Metabolic syndrome 01/27/2020   Atherosclerosis 01/27/2020   Tobacco abuse disorder 11/13/2019   Upper respiratory tract infection 11/13/2019   Sprain of right foot 11/13/2019   Seasonal allergic rhinitis 11/13/2019   Essential hypertension 11/13/2019   PCP: Odell Tor Edra CINDERELLA, MD  REFERRING PROVIDER: Odell Tor, Edra CINDERELLA, MD  REFERRING DIAG:  Diagnosis  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 Reports >1 year R shoulder pain with no MOI.  Pt. States initial corticosteroid use decreased pain for a couple weeks but pain has returned.  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  Pt. Reports no clicking or popping in R shoulder with overhead reaching.  Pt. Reports 2/10 R shoulder pain and describes pain as hot nail and sharp pain which resolves quick.   Hand  dominance: Right  PERTINENT HISTORY: See MD notes.  S/p L AKA since 1977.    PAIN:  Are you having pain? Yes: NPRS scale: 2/10 Pain location: R shoulder Pain description: sharp/ hot nail Aggravating factors: lifting/ end-range Relieving factors: rest  PRECAUTIONS: None  RED FLAGS: None   WEIGHT BEARING RESTRICTIONS: No  FALLS:  Has patient fallen in last 6 months? No  LIVING ENVIRONMENT: Lives with: lives with their spouse Lives in: House/apartment Has following equipment at home: None  OCCUPATION: Product/process development scientist  PLOF: Independent  PATIENT GOALS:  Decrease pain in R shoulder with all  tasks, working/ golfing.    NEXT MD VISIT:   OBJECTIVE:  Note: Objective measures were completed at Evaluation unless otherwise noted.  DIAGNOSTIC FINDINGS:  N/A  PATIENT SURVEYS:  QuickDASH: 11.4%  COGNITION: Overall cognitive status: Within functional limits for tasks assessed     SENSATION: WFL  POSTURE: Slight rounded shoulder posture in sitting  UPPER EXTREMITY ROM:   Active ROM Right eval Left eval  Shoulder flexion WNL WNL  Shoulder extension    Shoulder abduction WNL (pain) WNL  Shoulder adduction    Shoulder internal rotation    Shoulder external rotation    Elbow flexion WNL WNL  Elbow extension WNL WNL  Wrist flexion    Wrist extension    Wrist ulnar deviation    Wrist radial deviation    Wrist pronation    Wrist supination    (Blank rows = not tested)  Cervical AROM: flexion 55 deg./ extension 35 deg./ R lat. Flex. 35 deg./ L lat. Flex. 25 deg./ R rotn. 65 deg./ L rotn. 49 deg.    UPPER EXTREMITY MMT:  MMT Right eval Left eval  Shoulder flexion 5 5  Shoulder extension    Shoulder abduction 4+ (pain) 5  Shoulder adduction    Shoulder internal rotation 5 5  Shoulder external rotation 5 5  Middle trapezius    Lower trapezius    Elbow flexion 5 5  Elbow extension 5 5  Wrist flexion    Wrist extension    Wrist ulnar deviation     Wrist radial deviation    Wrist pronation    Wrist supination    Grip strength (lbs) 81.5# 94.3#  (Blank rows = not tested)  SHOULDER SPECIAL TESTS: Impingement tests: Neer impingement test: negative and Hawkins/Kennedy impingement test: negative SLAP lesions: NT Instability tests: Posterior drawer test: negative and Sulcus sign: negative Rotator cuff assessment: Empty can test: negative, Full can test: negative, and Belly press test: positive  Biceps assessment: NT  JOINT MOBILITY TESTING:  Slight R shoulder hypomobility with PA mobs  PALPATION:  Minimal tenderness along mid-posterior deltoid.  No swelling or bruising noted.                                                                                                                              TREATMENT DATE: 06/16/2024  Subjective:  Pt reports 2-3/10 pain in shoulder. Reports shoulder is feeling stiff with the cold weather. Pt was able to get his [employer] membership and is ready to start but needs direction for what exercises to do.  B UBE 3 min. F/b (warm-up/ discussed weekend activities).    Therapeutic activity.  Nautilus: Standing scapular retractions 30# 1x15,  Seated Lat pull downs 50# x10 then 60# x10. Standing chest press 30# 1x10, Eccentric B UE Cable fly 30 # 2x15, Right arm ER 30# x10. (Focusing on slow and controlled movement)   Supine serratus punches 2x10. Last set with 2# weight.  Rhythmic  stabilization of shoulder in supine. 3x30sec  Manual therapy   Supine R shoulder distraction/AP mobs/ STM/ Pec stretch for pain relief and joint mobility.    PATIENT EDUCATION: Education details: R shoulder pain/ modalities/ Location Manager ex. Program).   Person educated: Patient Education method: Explanation Education comprehension: verbalized understanding and returned demonstration  HOME EXERCISE PROGRAM: Access Code: [email or web address] URL: [email or web address] Date:  06/09/2024 Prepared by: Ozell Sero  Exercises - Scapular Retraction with Resistance  - 1 x daily - 4-5 x weekly - 2 sets - 10 reps - Shoulder External Rotation and Scapular Retraction with Resistance  - 1 x daily - 4-5 x weekly - 2 sets - 10 reps - Standing Single Arm Shoulder Abduction with Dumbbell - Thumb Up  - 1 x daily - 4-5 x weekly - 2 sets - 10 reps - Prone Single Arm Shoulder Y  - 1 x daily - 4-5 x weekly - 2 sets - 10 reps   ASSESSMENT:  CLINICAL IMPRESSION: Pt limited with pain during end range R shoulder abduction and PT modified exercise accordingly. Patient had no increase of pain with resisted Nautilus/ gym based there.ex.  PT will issues a more specific gym based program next session.  Pt will benefit skilled physical therapy for ROM and strengthening and pain relief.   OBJECTIVE IMPAIRMENTS: decreased activity tolerance, decreased ROM, decreased strength, impaired flexibility, postural dysfunction, and pain.   ACTIVITY LIMITATIONS: carrying, lifting, reach over head, and locomotion level  PARTICIPATION LIMITATIONS: community activity, occupation, and yard work  PERSONAL FACTORS: Fitness and Past/current experiences are also affecting patient's functional outcome.   REHAB POTENTIAL: Good  CLINICAL DECISION MAKING: Stable/uncomplicated  EVALUATION COMPLEXITY: Low   GOALS: Goals reviewed with patient? Yes  SHORT TERM GOALS: Target date: 06/17/24  Pt. Independent with HEP to increase R shoulder abduction strength 1/2 muscle grade to improve pain-free mobility. Baseline:  R shoulder abduction 4+/5 (marked pain) Goal status: INITIAL  2.  Pt. Will increase cervical AROM (L lat. Flex./ L rotn.) to WNL as compared to R to improve posture/ R shoulder mobility with functional tasks.   Baseline: L lat. Flex. 25 deg./ R rotn. 65 deg./ L rotn. 49 deg.   Goal status: INITIAL  LONG TERM GOALS: Target date: 07/01/24  Pt. Will decrese QuickDASH to <5% to improve pain-free  mobility.   Baseline: 11.4% Goal status: INITIAL  2.  Pt. Able to complete 45 minute gym based exercise program [employer]) with no R shoulder pain or limitations.   Baseline: pt. Planning to join gym Goal status: INITIAL  3.  Pt. Able to golf with no R shoulder pain or limitations to improve pain-free mobility.     Baseline: R shoulder pain Goal status: INITIAL   PLAN:  PT FREQUENCY: 2x/week  PT DURATION: 4 weeks  PLANNED INTERVENTIONS: 97110-Therapeutic exercises, 97530- Therapeutic activity, [id number]- Neuromuscular re-education, 97535- Self Care, [id number]- Manual therapy, [id number]- Electrical stimulation (unattended), 20560 (1-2 muscles), 20561 (3+ muscles)- Dry Needling, Patient/Family education, Taping, Joint mobilization, Cryotherapy, and Moist heat  PLAN FOR NEXT SESSION: CHECK GOALS/ issued gym based ex. Specific for [employer].  Pt. Has 2 more authorized visit.    Ozell JAYSON Sero, PT, DPT # [phone] Rankin Gainer, SPT. 06/16/2024, 5:45 PM  "

## 2024-06-18 ENCOUNTER — Ambulatory Visit: Admitting: Physical Therapy

## 2024-06-18 ENCOUNTER — Encounter: Payer: Self-pay | Admitting: Physical Therapy

## 2024-06-18 DIAGNOSIS — G8929 Other chronic pain: Secondary | ICD-10-CM

## 2024-06-18 DIAGNOSIS — M6281 Muscle weakness (generalized): Secondary | ICD-10-CM

## 2024-06-18 DIAGNOSIS — M25511 Pain in right shoulder: Secondary | ICD-10-CM | POA: Diagnosis not present

## 2024-06-18 DIAGNOSIS — M25611 Stiffness of right shoulder, not elsewhere classified: Secondary | ICD-10-CM

## 2024-06-18 NOTE — Therapy (Signed)
 " OUTPATIENT PHYSICAL THERAPY SHOULDER TREATMENT   Patient Name: Roger Simpson MRN: [id number] DOB:1955/09/21, 69 y.o., male Today's Date: 06/18/2024  END OF SESSION:  PT End of Session - 06/18/24 1118     Visit Number 5    Number of Visits 8    Date for Recertification  07/01/24    PT Start Time 1115    PT Stop Time 1205    PT Time Calculation (min) 50 min    Activity Tolerance Patient tolerated treatment well;Patient limited by pain         Past Medical History:  Diagnosis Date   Hypertension    Past Surgical History:  Procedure Laterality Date   LEG AMPUTATION Left    Patient Active Problem List   Diagnosis Date Noted   Metabolic syndrome 01/27/2020   Atherosclerosis 01/27/2020   Tobacco abuse disorder 11/13/2019   Upper respiratory tract infection 11/13/2019   Sprain of right foot 11/13/2019   Seasonal allergic rhinitis 11/13/2019   Essential hypertension 11/13/2019   PCP: Odell Tor Edra CINDERELLA, MD  REFERRING PROVIDER: Odell Tor, Edra CINDERELLA, MD  REFERRING DIAG:  Diagnosis  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 Reports >1 year R shoulder pain with no MOI.  Pt. States initial corticosteroid use decreased pain for a couple weeks but pain has returned.  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  Pt. Reports no clicking or popping in R shoulder with overhead reaching.  Pt. Reports 2/10 R shoulder pain and describes pain as hot nail and sharp pain which resolves quick.   Hand  dominance: Right  PERTINENT HISTORY: See MD notes.  S/p L AKA since 1977.    PAIN:  Are you having pain? Yes: NPRS scale: 2/10 Pain location: R shoulder Pain description: sharp/ hot nail Aggravating factors: lifting/ end-range Relieving factors: rest  PRECAUTIONS: None  RED FLAGS: None   WEIGHT BEARING RESTRICTIONS: No  FALLS:  Has patient fallen in last 6 months? No  LIVING ENVIRONMENT: Lives with: lives with their spouse Lives in: House/apartment Has following equipment at home: None  OCCUPATION: Product/process development scientist  PLOF: Independent  PATIENT GOALS:  Decrease pain in R shoulder with all tasks,  working/ golfing.    NEXT MD VISIT:   OBJECTIVE:  Note: Objective measures were completed at Evaluation unless otherwise noted.  DIAGNOSTIC FINDINGS:  N/A  PATIENT SURVEYS:  QuickDASH: 11.4%  COGNITION: Overall cognitive status: Within functional limits for tasks assessed     SENSATION: WFL  POSTURE: Slight rounded shoulder posture in sitting  UPPER EXTREMITY ROM:   Active ROM Right eval Left eval  Shoulder flexion WNL WNL  Shoulder extension    Shoulder abduction WNL (pain) WNL  Shoulder adduction    Shoulder internal rotation    Shoulder external rotation    Elbow flexion WNL WNL  Elbow extension WNL WNL  Wrist flexion    Wrist extension    Wrist ulnar deviation    Wrist radial deviation    Wrist pronation    Wrist supination    (Blank rows = not tested)  Cervical AROM: flexion 55 deg./ extension 35 deg./ R lat. Flex. 35 deg./ L lat. Flex. 25 deg./ R rotn. 65 deg./ L rotn. 49 deg.    UPPER EXTREMITY MMT:  MMT Right eval Left eval  Shoulder flexion 5 5  Shoulder extension    Shoulder abduction 4+ (pain) 5  Shoulder adduction    Shoulder internal rotation 5 5  Shoulder external rotation 5 5  Middle trapezius    Lower trapezius    Elbow flexion 5 5  Elbow extension 5 5  Wrist flexion    Wrist extension    Wrist ulnar deviation     Wrist radial deviation    Wrist pronation    Wrist supination    Grip strength (lbs) 81.5# 94.3#  (Blank rows = not tested)  SHOULDER SPECIAL TESTS: Impingement tests: Neer impingement test: negative and Hawkins/Kennedy impingement test: negative SLAP lesions: NT Instability tests: Posterior drawer test: negative and Sulcus sign: negative Rotator cuff assessment: Empty can test: negative, Full can test: negative, and Belly press test: positive  Biceps assessment: NT  JOINT MOBILITY TESTING:  Slight R shoulder hypomobility with PA mobs  PALPATION:  Minimal tenderness along mid-posterior deltoid.  No swelling or bruising noted.                                                                                                                              TREATMENT DATE: 06/18/2024  Subjective:   Pt reports no pain in the shoulder today. Reports that shoulder is feeling sore since the beginning of this week. Pt reports that he recently got a ice pack that has been helping his shoulder. Pt reports that he is planning to go to [employer] this weekend to try gym based there.ex.  There.ex.:  B UBE 3 min. F/b (warm-up/ discussed weekend activities).   Nautilus: Standing scapular retractions 30# 1x15 (Increase in weight limited by balance),  Seated Lat pull downs 60# x10 then 70# x10. Standing chest press 50# 1x10, Eccentric B UE Cable fly 30 # 2x15 40# (2nd set), Right  arm ER 30# 2x10. (Focusing on slow and controlled movement)   Supine serratus punches 3x10 with 2# weight.  Rhythmic stabilization of shoulder in supine. 3x30sec  Gym based program: issued handouts specific for [employer] Seated pec fly machine 2x10. 15# Lat pull downs 2x12. 40# Seated cable row (retraction) 2x12. 20# Standing ER 2x15. 10# Reverse pec fly 2x10. 15#  Manual therapy   Supine R shoulder distraction/AP mobs/ STM/ Pec stretch for pain relief and joint mobility.    PATIENT EDUCATION: Education  details: R shoulder pain/ modalities/ Location Manager ex. Program).   Person educated: Patient Education method: Explanation Education comprehension: verbalized understanding and returned demonstration  HOME EXERCISE PROGRAM: Access Code: [email or web address] URL: [email or web address] Date: 06/09/2024 Prepared by: Ozell Sero  Exercises - Scapular Retraction with Resistance  - 1 x daily - 4-5 x weekly - 2 sets - 10 reps - Shoulder External Rotation and Scapular Retraction with Resistance  - 1 x daily - 4-5 x weekly - 2 sets - 10 reps - Standing Single Arm Shoulder Abduction with Dumbbell - Thumb Up  - 1 x daily - 4-5 x weekly - 2 sets - 10 reps - Prone Single Arm Shoulder Y  - 1 x daily - 4-5 x weekly - 2 sets - 10 reps   ASSESSMENT:  CLINICAL IMPRESSION: Pt was less limited with R shoulder abduction range with cable fly today. Pt had no c/o pain with increase of weight with Nautilus/ gym based there.ex. Pt understood gym based program and is eager to start.  Pt. Instructed to avoid any pain provoking movement patterns with R shoulder.  Pt will benefit skilled physical therapy for ROM and strengthening and pain relief.    OBJECTIVE IMPAIRMENTS: decreased activity tolerance, decreased ROM, decreased strength, impaired flexibility, postural dysfunction, and pain.   ACTIVITY LIMITATIONS: carrying, lifting, reach over head, and locomotion level  PARTICIPATION LIMITATIONS: community activity, occupation, and yard work  PERSONAL FACTORS: Fitness and Past/current experiences are also affecting patient's functional outcome.   REHAB POTENTIAL: Good  CLINICAL DECISION MAKING: Stable/uncomplicated  EVALUATION COMPLEXITY: Low   GOALS: Goals reviewed with patient? Yes  SHORT TERM GOALS: Target date: 06/17/24  Pt. Independent with HEP to increase R shoulder abduction strength 1/2 muscle grade to improve pain-free mobility. Baseline:  R shoulder abduction 4+/5 (marked  pain) Goal status: On-going  2.  Pt. Will increase cervical AROM (L lat. Flex./ L rotn.) to WNL as compared to R to improve posture/ R shoulder mobility with functional tasks.   Baseline: L lat. Flex. 25 deg./ R rotn. 65 deg./ L rotn. 49 deg.   Goal status: INITIAL  LONG TERM GOALS: Target date: 07/01/24  Pt. Will decrese QuickDASH to <5% to improve pain-free mobility.   Baseline: 11.4% Goal status: INITIAL  2.  Pt. Able to complete 45 minute gym based exercise program [employer]) with no R shoulder pain or limitations.   Baseline: pt. Planning to join gym Goal status: INITIAL  3.  Pt. Able to golf with no R shoulder pain or limitations to improve pain-free mobility.     Baseline: R shoulder pain Goal status: INITIAL   PLAN:  PT FREQUENCY: 2x/week  PT DURATION: 4 weeks  PLANNED INTERVENTIONS: 97110-Therapeutic exercises, 97530- Therapeutic activity, [id number]- Neuromuscular re-education, 97535- Self Care, 02859- Manual therapy, [id number]- Electrical stimulation (unattended), 20560 (1-2 muscles), 20561 (3+ muscles)- Dry Needling, Patient/Family education, Taping, Joint mobilization, Cryotherapy, and Moist heat  PLAN FOR NEXT SESSION: CHECK GOALS on last authorized  visit  Ozell JAYSON Sero, PT, DPT # [phone] Rankin Gainer, SPT. 06/18/2024, 2:04 PM  "

## 2024-06-23 ENCOUNTER — Ambulatory Visit: Admitting: Physical Therapy

## 2024-06-23 ENCOUNTER — Encounter: Payer: Self-pay | Admitting: Physical Therapy

## 2024-06-23 DIAGNOSIS — M25611 Stiffness of right shoulder, not elsewhere classified: Secondary | ICD-10-CM

## 2024-06-23 DIAGNOSIS — M25511 Pain in right shoulder: Secondary | ICD-10-CM | POA: Diagnosis not present

## 2024-06-23 DIAGNOSIS — M6281 Muscle weakness (generalized): Secondary | ICD-10-CM

## 2024-06-23 DIAGNOSIS — G8929 Other chronic pain: Secondary | ICD-10-CM

## 2024-06-23 NOTE — Therapy (Signed)
 " OUTPATIENT PHYSICAL THERAPY SHOULDER TREATMENT/ DISCHARGE   Patient Name: Roger Simpson MRN: [id number] DOB:[birth year], 69 y.o., male Today's Date: 06/23/2024  END OF SESSION:  PT End of Session - 06/23/24 1113     Visit Number 6    Number of Visits 8    Date for Recertification  07/01/24    PT Start Time 1113    PT Stop Time 1203    PT Time Calculation (min) 50 min    Activity Tolerance Patient tolerated treatment well;Patient limited by pain         Past Medical History:  Diagnosis Date   Hypertension    Past Surgical History:  Procedure Laterality Date   LEG AMPUTATION Left    Patient Active Problem List   Diagnosis Date Noted   Metabolic syndrome 01/27/2020   Atherosclerosis 01/27/2020   Tobacco abuse disorder 11/13/2019   Upper respiratory tract infection 11/13/2019   Sprain of right foot 11/13/2019   Seasonal allergic rhinitis 11/13/2019   Essential hypertension 11/13/2019   PCP: Odell Tor Edra CINDERELLA, MD  REFERRING PROVIDER: Odell Tor, Edra CINDERELLA, MD  REFERRING DIAG:  Diagnosis  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 Reports >1 year R shoulder pain with no MOI.  Pt. States initial corticosteroid use decreased pain for a couple weeks but pain has returned.  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  Pt. Reports no clicking or popping in R shoulder with overhead reaching.  Pt. Reports 2/10 R shoulder pain and describes pain as hot nail and sharp pain which resolves quick.    Hand dominance: Right  PERTINENT HISTORY: See MD notes.  S/p L AKA since 1977.    PAIN:  Are you having pain? Yes: NPRS scale: 2/10 Pain location: R shoulder Pain description: sharp/ hot nail Aggravating factors: lifting/ end-range Relieving factors: rest  PRECAUTIONS: None  RED FLAGS: None   WEIGHT BEARING RESTRICTIONS: No  FALLS:  Has patient fallen in last 6 months? No  LIVING ENVIRONMENT: Lives with: lives with their spouse Lives in: House/apartment Has following equipment at home: None  OCCUPATION: Product/process development scientist  PLOF: Independent  PATIENT GOALS:  Decrease pain in R shoulder with all  tasks, working/ golfing.    NEXT MD VISIT:   OBJECTIVE:  Note: Objective measures were completed at Evaluation unless otherwise noted.  DIAGNOSTIC FINDINGS:  N/A  PATIENT SURVEYS:  QuickDASH: 11.4%  COGNITION: Overall cognitive status: Within functional limits for tasks assessed     SENSATION: WFL  POSTURE: Slight rounded shoulder posture in sitting  UPPER EXTREMITY ROM:   Active ROM Right eval Left eval  Shoulder flexion WNL WNL  Shoulder extension    Shoulder abduction WNL (pain) WNL  Shoulder adduction    Shoulder internal rotation    Shoulder external rotation    Elbow flexion WNL WNL  Elbow extension WNL WNL  Wrist flexion    Wrist extension    Wrist ulnar deviation    Wrist radial deviation    Wrist pronation    Wrist supination    (Blank rows = not tested)  Cervical AROM: flexion 55 deg./ extension 35 deg./ R lat. Flex. 35 deg./ L lat. Flex. 25 deg./ R rotn. 65 deg./ L rotn. 49 deg.    UPPER EXTREMITY MMT:  MMT Right eval Left eval  Shoulder flexion 5 5  Shoulder extension    Shoulder abduction 4+ (pain) 5  Shoulder adduction    Shoulder internal rotation 5 5  Shoulder external rotation 5 5  Middle trapezius    Lower trapezius    Elbow flexion 5 5  Elbow extension 5 5  Wrist flexion    Wrist extension    Wrist ulnar  deviation    Wrist radial deviation    Wrist pronation    Wrist supination    Grip strength (lbs) 81.5# 94.3#  (Blank rows = not tested)  SHOULDER SPECIAL TESTS: Impingement tests: Neer impingement test: negative and Hawkins/Kennedy impingement test: negative SLAP lesions: NT Instability tests: Posterior drawer test: negative and Sulcus sign: negative Rotator cuff assessment: Empty can test: negative, Full can test: negative, and Belly press test: positive  Biceps assessment: NT  JOINT MOBILITY TESTING:  Slight R shoulder hypomobility with PA mobs  PALPATION:  Minimal tenderness along mid-posterior deltoid.  No swelling or bruising noted.                                                                                                                              TREATMENT DATE: 06/23/2024  Subjective:   Pt reports 2/10 shoulder pain today. Pt reports shoulder is feeling a little sore since last week. Was not able to make it out to the gym this week. Pt reports that ice pack is still helping shoulder. Pt is excited to start at [employer] this week.  There.ex.:  B UBE 3 min. F/b (warm-up/ discussed weekend activities).   Nautilus: Standing scapular retractions 30# 1x15 (Increase in weight limited by balance), Seated Scapular retractions 50#  Seated Lat pull downs 60# x10 then 70# x10. Standing chest press 50# 1x10 then 60 10x, Eccentric B UE Cable fly 40 # 2x15 40#,  Right arm ER 30# 2x10 then 40#. (Focusing on slow and controlled movement)   Supine serratus punches 3x10 with 9# weight.  Rhythmic stabilization of shoulder in supine. 4x30sec  Gym based program: issued handouts specific for [employer] Seated pec fly machine 2x10. 15# Lat pull downs 2x12. 40# Seated cable row (retraction) 2x12. 20# Standing ER 2x15. 10# Reverse pec fly 2x10. 15#  Manual therapy   Supine R shoulder distraction/AP mobs/ STM/ Pec stretch for pain relief and joint mobility.   QuickDASH:   6.81% (improvement)   PATIENT EDUCATION: Education details: R shoulder pain/ modalities/ Location Manager ex. Program).   Person educated: Patient Education method: Explanation Education comprehension: verbalized understanding and returned demonstration  HOME EXERCISE PROGRAM: Access Code: [email or web address] URL: [email or web address] Date: 06/09/2024 Prepared by: Ozell Sero  Exercises - Scapular Retraction with Resistance  - 1 x daily - 4-5 x weekly - 2 sets - 10 reps - Shoulder External Rotation and Scapular Retraction with Resistance  - 1 x daily - 4-5 x weekly - 2 sets - 10 reps - Standing Single Arm Shoulder Abduction with Dumbbell - Thumb Up  - 1 x daily - 4-5 x weekly - 2 sets - 10 reps - Prone Single Arm Shoulder Y  - 1 x daily - 4-5 x weekly - 2 sets - 10 reps   ASSESSMENT:  CLINICAL IMPRESSION: Pt did well today with the increase of weight at [employer] based there ex with no c/o shoulder pain. Pt is ready to start gym based program this week, and eager to get better.  Pt. Instructed to avoid any pain provoking movement patterns with R shoulder. Pt also had improvement of his QuickDASH score to 6.81%. Pt completed his last visit today and will complete gym based program and will contact PT with any concerns. Updated PT goals.    OBJECTIVE IMPAIRMENTS: decreased activity tolerance, decreased ROM, decreased strength, impaired flexibility, postural dysfunction, and pain.   ACTIVITY LIMITATIONS: carrying, lifting, reach over head, and locomotion level  PARTICIPATION LIMITATIONS: community activity, occupation, and yard work  PERSONAL FACTORS: Fitness and Past/current experiences are also affecting patient's functional outcome.   REHAB POTENTIAL: Good  CLINICAL DECISION MAKING: Stable/uncomplicated  EVALUATION COMPLEXITY: Low   GOALS: Goals reviewed with patient? Yes  SHORT TERM GOALS: Target date: 06/17/24  Pt. Independent with HEP to  increase R shoulder abduction strength 1/2 muscle grade to improve pain-free mobility. Baseline:  R shoulder abduction 4+/5 (marked pain) Goal status: Partially met  2.  Pt. Will increase cervical AROM (L lat. Flex./ L rotn.) to WNL as compared to R to improve posture/ R shoulder mobility with functional tasks.   Baseline: L lat. Flex. 25 deg./ R rotn. 65 deg./ L rotn. 49 deg.   Goal status: Partially met  LONG TERM GOALS: Target date: 07/01/24  Pt. Will decrese QuickDASH to <5% to improve pain-free mobility.   Baseline: 11.4%.  1/27:  6.81% Goal status: Partially met  2.  Pt. Able to complete 45 minute gym based exercise program [employer]) with no R shoulder pain or limitations.   Baseline: pt. Planning to join gym Goal status:On-going  3.  Pt. Able to golf with no R shoulder pain or limitations to improve pain-free mobility.     Baseline: R shoulder pain Goal status: On-going   PLAN:  PT FREQUENCY: 2x/week  PT DURATION: 4 weeks  PLANNED INTERVENTIONS: 97110-Therapeutic exercises, 97530- Therapeutic activity, [id number]- Neuromuscular re-education, 97535- Self Care, [id number]- Manual therapy, [id number]-  Electrical stimulation (unattended), 20560 (1-2 muscles), 20561 (3+ muscles)- Dry Needling, Patient/Family education, Taping, Joint mobilization, Cryotherapy, and Moist heat  PLAN FOR NEXT SESSION: Pt. Instructed to contact PT with any questions or regression in shoulder pain/symptoms.    Ozell JAYSON Sero, PT, DPT # [phone] Rankin Gainer, SPT. 06/23/2024, 3:28 PM  "

## 2024-06-25 ENCOUNTER — Ambulatory Visit: Admitting: Physical Therapy

## 2024-06-30 ENCOUNTER — Ambulatory Visit: Admitting: Physical Therapy

## 2024-07-02 ENCOUNTER — Ambulatory Visit: Admitting: Physical Therapy

## 2024-07-07 ENCOUNTER — Ambulatory Visit: Admitting: Physical Therapy

## 2024-07-09 ENCOUNTER — Ambulatory Visit: Admitting: Physical Therapy

## 2024-07-14 ENCOUNTER — Ambulatory Visit: Admitting: Physical Therapy

## 2024-07-16 ENCOUNTER — Ambulatory Visit: Admitting: Physical Therapy

## 2024-07-21 ENCOUNTER — Ambulatory Visit: Admitting: Physical Therapy

## 2024-07-23 ENCOUNTER — Ambulatory Visit: Admitting: Physical Therapy
